# Patient Record
Sex: Female | Born: 1950 | Race: White | Hispanic: No | Smoking: Never smoker
Health system: Southern US, Community
[De-identification: ages and names within clinical notes are randomized; demographics above are authoritative.]

## PROBLEM LIST (undated history)

## (undated) DIAGNOSIS — E079 Disorder of thyroid, unspecified: Secondary | ICD-10-CM

## (undated) DIAGNOSIS — M1990 Unspecified osteoarthritis, unspecified site: Secondary | ICD-10-CM

## (undated) DIAGNOSIS — K219 Gastro-esophageal reflux disease without esophagitis: Secondary | ICD-10-CM

## (undated) DIAGNOSIS — F339 Major depressive disorder, recurrent, unspecified: Secondary | ICD-10-CM

## (undated) DIAGNOSIS — Z803 Family history of malignant neoplasm of breast: Secondary | ICD-10-CM

## (undated) DIAGNOSIS — T7840XA Allergy, unspecified, initial encounter: Secondary | ICD-10-CM

## (undated) DIAGNOSIS — E119 Type 2 diabetes mellitus without complications: Secondary | ICD-10-CM

## (undated) DIAGNOSIS — Z83518 Family history of other specified eye disorder: Secondary | ICD-10-CM

## (undated) DIAGNOSIS — I10 Essential (primary) hypertension: Secondary | ICD-10-CM

## (undated) HISTORY — DX: Family history of malignant neoplasm of breast: Z80.3

## (undated) HISTORY — DX: Essential (primary) hypertension: I10

## (undated) HISTORY — DX: Unspecified osteoarthritis, unspecified site: M19.90

## (undated) HISTORY — DX: Disorder of thyroid, unspecified: E07.9

## (undated) HISTORY — DX: Gastro-esophageal reflux disease without esophagitis: K21.9

## (undated) HISTORY — DX: Type 2 diabetes mellitus without complications: E11.9

## (undated) HISTORY — PX: VAGINAL HYSTERECTOMY: SUR661

## (undated) HISTORY — DX: Allergy, unspecified, initial encounter: T78.40XA

## (undated) HISTORY — DX: Major depressive disorder, recurrent, unspecified: F33.9

## (undated) HISTORY — DX: Family history of other specified eye disorder: Z83.518

---

## 2010-02-09 HISTORY — PX: COLONOSCOPY: SHX174

## 2013-12-14 ENCOUNTER — Ambulatory Visit: Payer: Self-pay | Admitting: Physician Assistant

## 2014-03-19 ENCOUNTER — Ambulatory Visit: Payer: Self-pay | Admitting: Physician Assistant

## 2014-06-08 ENCOUNTER — Encounter: Payer: Self-pay | Admitting: Family Medicine

## 2014-06-08 DIAGNOSIS — Z86010 Personal history of colonic polyps: Secondary | ICD-10-CM | POA: Insufficient documentation

## 2014-06-08 DIAGNOSIS — Z8719 Personal history of other diseases of the digestive system: Secondary | ICD-10-CM | POA: Insufficient documentation

## 2014-06-08 DIAGNOSIS — R7303 Prediabetes: Secondary | ICD-10-CM | POA: Insufficient documentation

## 2014-06-08 DIAGNOSIS — E559 Vitamin D deficiency, unspecified: Secondary | ICD-10-CM | POA: Insufficient documentation

## 2014-06-08 DIAGNOSIS — Z803 Family history of malignant neoplasm of breast: Secondary | ICD-10-CM | POA: Insufficient documentation

## 2014-06-08 DIAGNOSIS — F1011 Alcohol abuse, in remission: Secondary | ICD-10-CM | POA: Insufficient documentation

## 2014-06-08 DIAGNOSIS — Z6835 Body mass index (BMI) 35.0-35.9, adult: Secondary | ICD-10-CM | POA: Insufficient documentation

## 2014-06-08 DIAGNOSIS — Z83518 Family history of other specified eye disorder: Secondary | ICD-10-CM

## 2014-06-08 DIAGNOSIS — E039 Hypothyroidism, unspecified: Secondary | ICD-10-CM | POA: Insufficient documentation

## 2014-06-08 DIAGNOSIS — Z8249 Family history of ischemic heart disease and other diseases of the circulatory system: Secondary | ICD-10-CM | POA: Insufficient documentation

## 2014-06-08 HISTORY — DX: Family history of other specified eye disorder: Z83.518

## 2014-06-08 HISTORY — DX: Family history of malignant neoplasm of breast: Z80.3

## 2014-07-18 ENCOUNTER — Other Ambulatory Visit: Payer: Self-pay | Admitting: Family Medicine

## 2014-07-18 DIAGNOSIS — I10 Essential (primary) hypertension: Secondary | ICD-10-CM

## 2014-07-18 MED ORDER — LOSARTAN POTASSIUM-HCTZ 100-12.5 MG PO TABS: 1.0000 | ORAL_TABLET | Freq: Every day | ORAL | Status: DC

## 2014-07-30 ENCOUNTER — Other Ambulatory Visit: Payer: Self-pay

## 2014-07-30 DIAGNOSIS — I10 Essential (primary) hypertension: Secondary | ICD-10-CM

## 2014-07-30 MED ORDER — LOSARTAN POTASSIUM-HCTZ 100-12.5 MG PO TABS: 1.0000 | ORAL_TABLET | Freq: Every day | ORAL | Status: DC

## 2014-08-15 ENCOUNTER — Other Ambulatory Visit: Payer: Self-pay

## 2014-08-15 DIAGNOSIS — Z8719 Personal history of other diseases of the digestive system: Secondary | ICD-10-CM

## 2014-08-15 MED ORDER — OMEPRAZOLE 40 MG PO CPDR: 40.0000 mg | DELAYED_RELEASE_CAPSULE | Freq: Every day | ORAL | Status: DC

## 2014-08-15 NOTE — Telephone Encounter (Signed)
[facility]

## 2014-10-09 ENCOUNTER — Other Ambulatory Visit: Payer: Self-pay

## 2014-10-09 DIAGNOSIS — K219 Gastro-esophageal reflux disease without esophagitis: Secondary | ICD-10-CM

## 2014-10-09 DIAGNOSIS — E039 Hypothyroidism, unspecified: Secondary | ICD-10-CM

## 2014-10-09 DIAGNOSIS — E119 Type 2 diabetes mellitus without complications: Secondary | ICD-10-CM

## 2014-10-09 DIAGNOSIS — I10 Essential (primary) hypertension: Secondary | ICD-10-CM

## 2014-10-09 DIAGNOSIS — Z8719 Personal history of other diseases of the digestive system: Secondary | ICD-10-CM

## 2014-10-09 MED ORDER — LOSARTAN POTASSIUM-HCTZ 100-12.5 MG PO TABS: 1.0000 | ORAL_TABLET | Freq: Every day | ORAL | Status: DC

## 2014-10-09 MED ORDER — OMEPRAZOLE 40 MG PO CPDR: 40.0000 mg | DELAYED_RELEASE_CAPSULE | Freq: Every day | ORAL | Status: DC

## 2014-10-09 MED ORDER — THYROID 90 MG PO TABS: 90.0000 mg | ORAL_TABLET | Freq: Every day | ORAL | Status: DC

## 2014-10-09 MED ORDER — METFORMIN HCL ER 500 MG PO TB24: 500.0000 mg | ORAL_TABLET | Freq: Every day | ORAL | Status: DC

## 2014-10-30 ENCOUNTER — Encounter: Payer: Self-pay | Admitting: Family Medicine

## 2014-10-30 ENCOUNTER — Ambulatory Visit (INDEPENDENT_AMBULATORY_CARE_PROVIDER_SITE_OTHER): Payer: 59 | Admitting: Family Medicine

## 2014-10-30 VITALS — BP 140/67 | HR 67 | Ht 66.0 in | Wt 221.6 lb

## 2014-10-30 DIAGNOSIS — E039 Hypothyroidism, unspecified: Secondary | ICD-10-CM | POA: Diagnosis not present

## 2014-10-30 DIAGNOSIS — I10 Essential (primary) hypertension: Secondary | ICD-10-CM | POA: Diagnosis not present

## 2014-10-30 DIAGNOSIS — M7662 Achilles tendinitis, left leg: Secondary | ICD-10-CM | POA: Diagnosis not present

## 2014-10-30 DIAGNOSIS — R7309 Other abnormal glucose: Secondary | ICD-10-CM

## 2014-10-30 DIAGNOSIS — R030 Elevated blood-pressure reading, without diagnosis of hypertension: Secondary | ICD-10-CM | POA: Insufficient documentation

## 2014-10-30 DIAGNOSIS — E669 Obesity, unspecified: Secondary | ICD-10-CM

## 2014-10-30 DIAGNOSIS — R5383 Other fatigue: Secondary | ICD-10-CM

## 2014-10-30 DIAGNOSIS — R7303 Prediabetes: Secondary | ICD-10-CM

## 2014-10-30 DIAGNOSIS — E559 Vitamin D deficiency, unspecified: Secondary | ICD-10-CM

## 2014-10-30 NOTE — Addendum Note (Signed)
Addended by: Schuyler Amor on: 10/30/2014 11:06 AM   Modules accepted: Orders

## 2014-10-30 NOTE — Progress Notes (Addendum)
Date:  10/30/2014   Name:  Shavonn Convey   DOB:  23-Aug-1950   MRN:  [id number]  PCP:  Schuyler Amor, MD    Chief Complaint: Foot Pain   History of Present Illness:  This is a 64 y.o. female c/o progressive L posterior heel pain over several months, worse in AM or with prolonged walking, Aspercreme/ibuprofen some help. Also with HTN, DM, vit D def, GERD well controlled, saw GI last spring told to continue PPI. Taking asa and chromium intermittently. C/o fatigue, insomnia, has been on antidepressants in past.  Review of Systems:  Review of Systems  Constitutional: Negative for fever, chills and unexpected weight change.  Respiratory: Negative for shortness of breath.   Cardiovascular: Negative for chest pain and leg swelling.  Endocrine: Negative for polyuria.  Genitourinary: Negative for difficulty urinating.    Patient Active Problem List   Diagnosis Date Noted  . Hypertension 10/30/2014  . Fatigue 06/08/2014  . Family history of breast cancer 06/08/2014  . Family history of premature coronary heart disease 06/08/2014  . Family history of macular degeneration 06/08/2014  . H/O alcohol abuse 06/08/2014  . History of colon polyps 06/08/2014  . H/O gastrointestinal disease 06/08/2014  . Adult hypothyroidism 06/08/2014  . Adiposity 06/08/2014  . Borderline diabetes 06/08/2014  . Avitaminosis D 06/08/2014    Prior to Admission medications   Medication Sig Start Date End Date Taking? Authorizing Provider  aspirin 81 MG tablet Take 1 tablet by mouth daily.   Yes Historical Provider, MD  cetirizine (ZYRTEC) 10 MG chewable tablet Chew 1 tablet by mouth daily as needed.   Yes Historical Provider, MD  losartan-hydrochlorothiazide (HYZAAR) 100-12.5 MG per tablet Take 1 tablet by mouth daily. 10/09/14  Yes Schuyler Amor, MD  metFORMIN (GLUCOPHAGE-XR) 500 MG 24 hr tablet Take 1 tablet (500 mg total) by mouth daily. 10/09/14  Yes Schuyler Amor, MD  Multiple Vitamins-Minerals (OCUVITE  ADULT 50+) CAPS Take 1 tablet by mouth daily.   Yes Historical Provider, MD  NATURE-THROID 97.5 MG TABS  10/03/14  Yes Historical Provider, MD  omeprazole (PRILOSEC) 40 MG capsule Take 1 capsule (40 mg total) by mouth daily. 10/09/14  Yes Schuyler Amor, MD  Vitamin D, Cholecalciferol, 1000 UNITS TABS Take 1 tablet by mouth daily. 05/30/14  Yes Historical Provider, MD    No Known Allergies  No past surgical history on file.  Social History  Substance Use Topics  . Smoking status: Never Smoker   . Smokeless tobacco: Not on file  . Alcohol Use: No    No family history on file.  Medication list has been reviewed and updated.  Physical Examination: BP 140/67 mmHg  Pulse 67  Ht  (1.676 m)  Wt 221 lb 9.6 oz (100.517 kg)  BMI 35.78 kg/m2  Physical Exam  Constitutional: She appears well-developed and well-nourished. No distress.  Cardiovascular: Normal rate, regular rhythm and normal heart sounds.   Pulmonary/Chest: Effort normal and breath sounds normal.  Musculoskeletal: She exhibits no edema.  Tender over Achilles insertion into L calcaneus No anterior calcaneal tenderness  Neurological: She is alert. Coordination normal.  Skin: Skin is warm and dry.  Psychiatric: She has a normal mood and affect. Her behavior is normal.    Assessment and Plan:  1. Tendonitis, Achilles, left Ibuprofen prn until sees podiatry - Ambulatory referral to Podiatry  2. Hypothyroidism, unspecified hypothyroidism type S/p I-131, on oral replacement - TSH  3. Essential hypertension Marginal control today, monitor  4. Borderline  diabetes Discussed chromium and asa use, no clear indication but pt may continue - HgB A1c - Lipid Profile  5. Avitaminosis D On supplementation - Vitamin D (25 hydroxy)  6. Adiposity Discussed exercise/weight loss  7. Other fatigue Possible depression, consider antidepressant if labs normal - Comprehensive metabolic panel - CBC - B12   Return in about  4 weeks (around 11/27/2014).  [name] MD [facility]  10/30/2014

## 2014-10-31 LAB — COMPREHENSIVE METABOLIC PANEL
ALBUMIN: 4.2 g/dL (ref 3.6–4.8)
ALK PHOS: 60 IU/L (ref 39–117)
ALT: 30 IU/L (ref 0–32)
AST: 23 IU/L (ref 0–40)
Albumin/Globulin Ratio: 1.8 (ref 1.1–2.5)
BUN / CREAT RATIO: 15 (ref 11–26)
BUN: 14 mg/dL (ref 8–27)
Bilirubin Total: 0.2 mg/dL (ref 0.0–1.2)
CO2: 25 mmol/L (ref 18–29)
CREATININE: 0.96 mg/dL (ref 0.57–1.00)
Calcium: 9.4 mg/dL (ref 8.7–10.3)
Chloride: 100 mmol/L (ref 97–108)
GFR calc non Af Amer: 64 mL/min/{1.73_m2} (ref >59)
GFR, EST AFRICAN AMERICAN: 73 mL/min/{1.73_m2} (ref >59)
GLOBULIN, TOTAL: 2.4 g/dL (ref 1.5–4.5)
Glucose: 95 mg/dL (ref 65–99)
Potassium: 4.2 mmol/L (ref 3.5–5.2)
SODIUM: 142 mmol/L (ref 134–144)
Total Protein: 6.6 g/dL (ref 6.0–8.5)

## 2014-10-31 LAB — LIPID PANEL
Chol/HDL Ratio: 3.5 ratio units (ref 0.0–4.4)
Cholesterol, Total: 191 mg/dL (ref 100–199)
HDL: 55 mg/dL (ref >39)
LDL Calculated: 108 mg/dL — ABNORMAL HIGH (ref 0–99)
Triglycerides: 142 mg/dL (ref 0–149)
VLDL CHOLESTEROL CAL: 28 mg/dL (ref 5–40)

## 2014-10-31 LAB — HEMOGLOBIN A1C
Est. average glucose Bld gHb Est-mCnc: 140 mg/dL
Hgb A1c MFr Bld: 6.5 % — ABNORMAL HIGH (ref 4.8–5.6)

## 2014-10-31 LAB — VITAMIN B12: Vitamin B-12: 479 pg/mL (ref 211–946)

## 2014-10-31 LAB — CBC
Hematocrit: 36 % (ref 34.0–46.6)
Hemoglobin: 12 g/dL (ref 11.1–15.9)
MCH: 29.1 pg (ref 26.6–33.0)
MCHC: 33.3 g/dL (ref 31.5–35.7)
MCV: 87 fL (ref 79–97)
PLATELETS: 314 10*3/uL (ref 150–379)
RBC: 4.12 x10E6/uL (ref 3.77–5.28)
RDW: 13.8 % (ref 12.3–15.4)
WBC: 7 10*3/uL (ref 3.4–10.8)

## 2014-10-31 LAB — TSH: TSH: 0.257 u[IU]/mL — ABNORMAL LOW (ref 0.450–4.500)

## 2014-10-31 LAB — VITAMIN D 25 HYDROXY (VIT D DEFICIENCY, FRACTURES): Vit D, 25-Hydroxy: 35.4 ng/mL (ref 30.0–100.0)

## 2014-10-31 MED ORDER — THYROID 81.25 MG PO TABS: 1.0000 | ORAL_TABLET | Freq: Every day | ORAL | Status: DC

## 2014-10-31 NOTE — Addendum Note (Signed)
Addended by: Schuyler Amor on: 10/31/2014 09:34 AM   Modules accepted: Orders

## 2014-10-31 NOTE — Addendum Note (Signed)
Addended by: Schuyler Amor on: 10/31/2014 09:30 AM   Modules accepted: Orders

## 2014-11-01 ENCOUNTER — Telehealth: Payer: Self-pay

## 2014-11-01 MED ORDER — THYROID 81.25 MG PO TABS: 1.0000 | ORAL_TABLET | Freq: Every day | ORAL | Status: DC

## 2014-11-01 NOTE — Telephone Encounter (Signed)
Patient requests new thyroid rx sent to [facility]

## 2014-11-01 NOTE — Addendum Note (Signed)
Addended by: Schuyler Amor on: 11/01/2014 02:59 PM   Modules accepted: Orders, Medications

## 2014-11-01 NOTE — Telephone Encounter (Signed)
Done

## 2014-11-30 ENCOUNTER — Ambulatory Visit (INDEPENDENT_AMBULATORY_CARE_PROVIDER_SITE_OTHER): Payer: 59 | Admitting: Family Medicine

## 2014-11-30 ENCOUNTER — Encounter: Payer: Self-pay | Admitting: Family Medicine

## 2014-11-30 VITALS — BP 122/80 | HR 68 | Ht 67.0 in | Wt 221.0 lb

## 2014-11-30 DIAGNOSIS — K219 Gastro-esophageal reflux disease without esophagitis: Secondary | ICD-10-CM | POA: Diagnosis not present

## 2014-11-30 DIAGNOSIS — I10 Essential (primary) hypertension: Secondary | ICD-10-CM

## 2014-11-30 DIAGNOSIS — E039 Hypothyroidism, unspecified: Secondary | ICD-10-CM

## 2014-11-30 DIAGNOSIS — E559 Vitamin D deficiency, unspecified: Secondary | ICD-10-CM | POA: Diagnosis not present

## 2014-11-30 DIAGNOSIS — E119 Type 2 diabetes mellitus without complications: Secondary | ICD-10-CM

## 2014-11-30 DIAGNOSIS — F339 Major depressive disorder, recurrent, unspecified: Secondary | ICD-10-CM | POA: Insufficient documentation

## 2014-11-30 DIAGNOSIS — F331 Major depressive disorder, recurrent, moderate: Secondary | ICD-10-CM | POA: Diagnosis not present

## 2014-11-30 DIAGNOSIS — K2100 Gastro-esophageal reflux disease with esophagitis, without bleeding: Secondary | ICD-10-CM | POA: Insufficient documentation

## 2014-11-30 HISTORY — DX: Major depressive disorder, recurrent, unspecified: F33.9

## 2014-11-30 MED ORDER — VENLAFAXINE HCL ER 75 MG PO CP24: 75.0000 mg | ORAL_CAPSULE | Freq: Every day | ORAL | Status: DC

## 2014-11-30 NOTE — Progress Notes (Signed)
Date:  11/30/2014   Name:  Audrey Harrison   DOB:  10/23/1950   MRN:  [id number]  PCP:  Schuyler AmorWilliam Quinterrius Errington, MD    Chief Complaint: Hypothyroidism   History of Present Illness:  This is a 64 y.o. female for f/u hypothyroidism, HTN, T2DM, vit D def, and fatigue. Blood work last visit ok except low TSH, Nature Thyroid decreased to 81.25 mg, for repeat TSH today. Saw podiatry for L heel spur rx'd with steroid taper and Lodine prn, improved. Continues with fatigue, works 3rd shift, has tried antidepressants a couple of times with some success but never stayed on, interested in trying another. Weight the same, not exercising regularly. Occ B ankle edema, resolves overnight.  Review of Systems:  Review of Systems  Constitutional: Negative for fever and unexpected weight change.  Respiratory: Negative for shortness of breath.   Cardiovascular: Negative for chest pain.  Gastrointestinal: Negative for abdominal pain, diarrhea and constipation.  Genitourinary: Negative for difficulty urinating.  Neurological: Negative for syncope and light-headedness.  Psychiatric/Behavioral: Negative for suicidal ideas and confusion.    Patient Active Problem List   Diagnosis Date Noted  . Depression, major, recurrent (HCC) 11/30/2014  . Hypertension 10/30/2014  . Fatigue 06/08/2014  . Family history of breast cancer 06/08/2014  . Family history of premature coronary heart disease 06/08/2014  . Family history of macular degeneration 06/08/2014  . H/O alcohol abuse 06/08/2014  . History of colon polyps 06/08/2014  . H/O gastrointestinal disease 06/08/2014  . Adult hypothyroidism 06/08/2014  . Obesity, Class I, BMI 30-34.9 06/08/2014  . Diabetes mellitus type 2, controlled, without complications (HCC) 06/08/2014  . Avitaminosis D 06/08/2014    Prior to Admission medications   Medication Sig Start Date End Date Taking? Authorizing Provider  aspirin 81 MG tablet Take 1 tablet by mouth daily.   Yes  Historical Provider, MD  cetirizine (ZYRTEC) 10 MG chewable tablet Chew 1 tablet by mouth daily as needed.   Yes Historical Provider, MD  etodolac (LODINE) 500 MG tablet Take 1 tablet by mouth 2 (two) times daily as needed. 11/05/14 12/05/14 Yes Historical Provider, MD  losartan-hydrochlorothiazide (HYZAAR) 100-12.5 MG per tablet Take 1 tablet by mouth daily. 10/09/14  Yes Schuyler AmorWilliam Kamisha Ell, MD  metFORMIN (GLUCOPHAGE-XR) 500 MG 24 hr tablet Take 1 tablet (500 mg total) by mouth daily. 10/09/14  Yes Schuyler AmorWilliam Zarie Kosiba, MD  Multiple Vitamins-Minerals (OCUVITE ADULT 50+) CAPS Take 1 tablet by mouth daily.   Yes Historical Provider, MD  omeprazole (PRILOSEC) 40 MG capsule Take 1 capsule (40 mg total) by mouth daily. 10/09/14  Yes Schuyler AmorWilliam Betzayda Braxton, MD  Thyroid (NATURE-THROID) 81.25 MG TABS Take 1 tablet by mouth daily. 11/01/14  Yes Schuyler AmorWilliam Sayer Masini, MD  Vitamin D, Cholecalciferol, 1000 UNITS TABS Take 1 tablet by mouth daily. 05/30/14  Yes Historical Provider, MD  venlafaxine XR (EFFEXOR-XR) 75 MG 24 hr capsule Take 1 capsule (75 mg total) by mouth daily with breakfast. 11/30/14   Schuyler AmorWilliam Malak Duchesneau, MD    No Known Allergies  Past Surgical History  Procedure Laterality Date  . Vaginal hysterectomy    . Colonoscopy  2012    cle[place] yrs- [place]    Social History  Substance Use Topics  . Smoking status: Never Smoker   . Smokeless tobacco: None  . Alcohol Use: No    Family History  Problem Relation Age of Onset  . Hypertension Mother   . Cancer Sister   . Hypertension Sister   . Cancer Brother   .  Diabetes Paternal Grandmother   . Hypertension Paternal Grandmother     Medication list has been reviewed and updated.  Physical Examination: BP 122/80 mmHg  Pulse 68  Ht  (1.702 m)  Wt 221 lb (100.245 kg)  BMI 34.61 kg/m2  Physical Exam  Constitutional: She is oriented to person, place, and time. She appears well-developed and well-nourished.  Cardiovascular: Normal rate, regular rhythm and  normal heart sounds.   Pulmonary/Chest: Effort normal and breath sounds normal.  Musculoskeletal: She exhibits no edema.  Neurological: She is alert and oriented to person, place, and time. Coordination normal.  Skin: Skin is warm and dry.  Psychiatric: She has a normal mood and affect. Her behavior is normal. Judgment and thought content normal.  Nursing note and vitals reviewed.   Assessment and Plan:  1. Moderate episode of recurrent major depressive disorder (HCC) Trial Effexor XR given fatigue - venlafaxine XR (EFFEXOR-XR) 75 MG 24 hr capsule; Take 1 capsule (75 mg total) by mouth daily with breakfast.  Dispense: 30 capsule; Refill: 2  2. Hypothyroidism, unspecified hypothyroidism type Recheck TSH on lower dose Nature Thyroid, consider conversion to Synthroid if titration difficult - TSH  3. Essential hypertension Well controlled, continue current regimen  4. Controlled type 2 diabetes mellitus without complication, without long-term current use of insulin (HCC) Well controlled (a1c 6.5%), continue metformin, needs MCR and optho exam  5. Avitaminosis D Well controlled, continue supplementation  6. GERD without esophagitis Well controlled, continue PPI per GI  7. Obesity Discussed exercise/weight loss   No Follow-up on file.  [name] MD [facility]  11/30/2014

## 2014-12-01 LAB — TSH: TSH: 0.429 u[IU]/mL — ABNORMAL LOW (ref 0.450–4.500)

## 2014-12-03 ENCOUNTER — Other Ambulatory Visit: Payer: Self-pay | Admitting: Family Medicine

## 2014-12-03 MED ORDER — THYROID 65 MG PO TABS: 65.0000 mg | ORAL_TABLET | Freq: Every day | ORAL | Status: DC

## 2014-12-03 NOTE — Addendum Note (Signed)
Addended by: Schuyler AmorPLONK, Dariyon Urquilla on: 12/03/2014 08:54 AM   Modules accepted: Orders, Medications

## 2014-12-06 ENCOUNTER — Telehealth: Payer: Self-pay

## 2014-12-06 DIAGNOSIS — E119 Type 2 diabetes mellitus without complications: Secondary | ICD-10-CM

## 2014-12-06 MED ORDER — METFORMIN HCL ER 500 MG PO TB24: 500.0000 mg | ORAL_TABLET | Freq: Every day | ORAL | Status: DC

## 2014-12-06 NOTE — Addendum Note (Signed)
Addended by: Schuyler AmorPLONK, Jawan Chavarria on: 12/06/2014 09:53 AM   Modules accepted: Orders

## 2014-12-06 NOTE — Telephone Encounter (Signed)
Glucophage XR refills sent

## 2014-12-31 ENCOUNTER — Encounter: Payer: Self-pay | Admitting: Family Medicine

## 2014-12-31 ENCOUNTER — Ambulatory Visit (INDEPENDENT_AMBULATORY_CARE_PROVIDER_SITE_OTHER): Payer: 59 | Admitting: Family Medicine

## 2014-12-31 VITALS — BP 132/78 | HR 88 | Ht 67.0 in | Wt 215.0 lb

## 2014-12-31 DIAGNOSIS — I10 Essential (primary) hypertension: Secondary | ICD-10-CM | POA: Diagnosis not present

## 2014-12-31 DIAGNOSIS — E119 Type 2 diabetes mellitus without complications: Secondary | ICD-10-CM

## 2014-12-31 DIAGNOSIS — F331 Major depressive disorder, recurrent, moderate: Secondary | ICD-10-CM | POA: Diagnosis not present

## 2014-12-31 DIAGNOSIS — E559 Vitamin D deficiency, unspecified: Secondary | ICD-10-CM

## 2014-12-31 DIAGNOSIS — K219 Gastro-esophageal reflux disease without esophagitis: Secondary | ICD-10-CM

## 2014-12-31 DIAGNOSIS — Z1159 Encounter for screening for other viral diseases: Secondary | ICD-10-CM | POA: Diagnosis not present

## 2014-12-31 DIAGNOSIS — E669 Obesity, unspecified: Secondary | ICD-10-CM

## 2014-12-31 DIAGNOSIS — E039 Hypothyroidism, unspecified: Secondary | ICD-10-CM

## 2014-12-31 MED ORDER — VENLAFAXINE HCL ER 150 MG PO CP24: 150.0000 mg | ORAL_CAPSULE | Freq: Every day | ORAL | Status: DC

## 2015-01-01 ENCOUNTER — Telehealth: Payer: Self-pay

## 2015-01-01 LAB — HEPATITIS C ANTIBODY

## 2015-01-01 LAB — TSH: TSH: 2.18 u[IU]/mL (ref 0.450–4.500)

## 2015-01-01 MED ORDER — VENLAFAXINE HCL ER 150 MG PO CP24: 150.0000 mg | ORAL_CAPSULE | Freq: Every day | ORAL | Status: DC

## 2015-01-01 NOTE — Progress Notes (Signed)
Date:  12/31/2014   Name:  Audrey Harrison   DOB:  [birth year]   MRN:  [id number]  PCP:  Schuyler AmorWilliam Lashanna Angelo, MD    Chief Complaint: Hypertension and Hypothyroidism   History of Present Illness:  This is a [age] y.o. female placed on Effexor XR 75 mg daily last month, feels mood and fatigue somewhat improved, a little more jittery. Nature thyroid reduced to 65 mg daily last month, says is lowest dose she's ever taken, switched to nature thyroid two years ago because Synthroid didn't seem to work well but hasn't noticed much difference. T2DM well controlled a1c 6.5% in September. On vit D supplementation with nl level in September. Has has tetanus and flu imms recently, had shingles imm in past, interested in being checked for hep C.  Review of Systems:  Review of Systems  Constitutional: Negative for fever and unexpected weight change.  Respiratory: Negative for shortness of breath.   Cardiovascular: Negative for chest pain and leg swelling.  Gastrointestinal: Negative for abdominal pain.  Endocrine: Negative for polyuria.  Genitourinary: Negative for difficulty urinating.  Neurological: Negative for syncope and light-headedness.    Patient Active Problem List   Diagnosis Date Noted  . Depression, major, recurrent (HCC) 11/30/2014  . GERD without esophagitis 11/30/2014  . Hypertension 10/30/2014  . Fatigue 06/08/2014  . Family history of breast cancer 06/08/2014  . Family history of premature coronary heart disease 06/08/2014  . Family history of macular degeneration 06/08/2014  . H/O alcohol abuse 06/08/2014  . History of colon polyps 06/08/2014  . H/O gastrointestinal disease 06/08/2014  . Adult hypothyroidism 06/08/2014  . Obesity, Class I, BMI 30-34.9 06/08/2014  . Diabetes mellitus type 2, controlled, without complications (HCC) 06/08/2014  . Avitaminosis D 06/08/2014    Prior to Admission medications   Medication Sig Start Date End Date Taking? Authorizing Provider   aspirin 81 MG tablet Take 1 tablet by mouth daily.   Yes Historical Provider, MD  cetirizine (ZYRTEC) 10 MG chewable tablet Chew 1 tablet by mouth daily as needed.   Yes Historical Provider, MD  losartan-hydrochlorothiazide (HYZAAR) 100-12.5 MG per tablet Take 1 tablet by mouth daily. 10/09/14  Yes Schuyler AmorWilliam Vincent Ehrler, MD  metFORMIN (GLUCOPHAGE-XR) 500 MG 24 hr tablet Take 1 tablet (500 mg total) by mouth daily. 12/06/14  Yes Schuyler AmorWilliam Jorma Tassinari, MD  Multiple Vitamins-Minerals (OCUVITE ADULT 50+) CAPS Take 1 tablet by mouth daily.   Yes Historical Provider, MD  omeprazole (PRILOSEC) 40 MG capsule Take 1 capsule (40 mg total) by mouth daily. 10/09/14  Yes Schuyler AmorWilliam Aaidyn San, MD  thyroid (ARMOUR) 65 MG tablet Take 1 tablet (65 mg total) by mouth daily. 12/03/14  Yes Schuyler AmorWilliam Travin Marik, MD  Vitamin D, Cholecalciferol, 1000 UNITS TABS Take 1 tablet by mouth daily. 05/30/14  Yes Historical Provider, MD  venlafaxine XR (EFFEXOR-XR) 150 MG 24 hr capsule Take 1 capsule (150 mg total) by mouth daily with breakfast. 01/01/15   Schuyler AmorWilliam Julieta Rogalski, MD    No Known Allergies  Past Surgical History  Procedure Laterality Date  . Vaginal hysterectomy    . Colonoscopy  2012    cleared for 10 yrs- [place]    Social History  Substance Use Topics  . Smoking status: Never Smoker   . Smokeless tobacco: None  . Alcohol Use: No    Family History  Problem Relation Age of Onset  . Hypertension Mother   . Cancer Sister   . Hypertension Sister   . Cancer Brother   . Diabetes  Paternal Grandmother   . Hypertension Paternal Grandmother     Medication list has been reviewed and updated.  Physical Examination: BP 132/78 mmHg  Pulse 88  Ht  (1.702 m)  Wt 215 lb (97.523 kg)  BMI 33.67 kg/m2  Physical Exam  Constitutional: She appears well-developed and well-nourished.  Cardiovascular: Normal rate, regular rhythm and normal heart sounds.   Pulmonary/Chest: Effort normal and breath sounds normal.  Musculoskeletal: She  exhibits no edema.  Neurological: She is alert.  No tremor  Skin: Skin is warm and dry.  Psychiatric: She has a normal mood and affect. Her behavior is normal.  Nursing note and vitals reviewed.   Assessment and Plan:  1. Moderate episode of recurrent major depressive disorder (HCC) Marginal improvement, increase Effexor XR to 150 mg daily  2. Hypothyroidism, unspecified hypothyroidism type Recheck level on lower dose Nature Thyroid - TSH  3. Controlled type 2 diabetes mellitus without complication, without long-term current use of insulin (HCC) Well controlled on daily metformin  4. Avitaminosis D Well controlled on oral supplementation  5. Obesity, Class I, BMI 30-34.9 Weight loss benefits discussed  6. Need for hepatitis C screening test - Hepatitis C Antibody  7. HTN Well controlled on current regimen  8. GERD Continue long term PPI per GI   Return in about 4 weeks (around 01/28/2015).  [name] MD [facility]  01/01/2015

## 2015-01-01 NOTE — Addendum Note (Signed)
Addended by: Schuyler AmorPLONK, Kendel Bessey on: 01/01/2015 08:49 AM   Modules accepted: Orders

## 2015-01-01 NOTE — Telephone Encounter (Signed)
[facility]

## 2015-01-30 ENCOUNTER — Encounter: Payer: Self-pay | Admitting: Family Medicine

## 2015-01-30 ENCOUNTER — Ambulatory Visit (INDEPENDENT_AMBULATORY_CARE_PROVIDER_SITE_OTHER): Payer: 59 | Admitting: Family Medicine

## 2015-01-30 VITALS — BP 128/80 | Ht 67.0 in | Wt 219.0 lb

## 2015-01-30 DIAGNOSIS — E119 Type 2 diabetes mellitus without complications: Secondary | ICD-10-CM | POA: Diagnosis not present

## 2015-01-30 DIAGNOSIS — E039 Hypothyroidism, unspecified: Secondary | ICD-10-CM | POA: Diagnosis not present

## 2015-01-30 DIAGNOSIS — E559 Vitamin D deficiency, unspecified: Secondary | ICD-10-CM

## 2015-01-30 DIAGNOSIS — I10 Essential (primary) hypertension: Secondary | ICD-10-CM

## 2015-01-30 DIAGNOSIS — F331 Major depressive disorder, recurrent, moderate: Secondary | ICD-10-CM

## 2015-01-30 DIAGNOSIS — E669 Obesity, unspecified: Secondary | ICD-10-CM

## 2015-01-30 NOTE — Progress Notes (Signed)
Date:  01/30/2015   Name:  Audrey Harrison   DOB:  February 18, 1950   MRN:  [id number]  PCP:  Schuyler Amor, MD    Chief Complaint: Diabetes; Hypertension; and Hypothyroidism   History of Present Illness:  This is a 64 y.o. female for f/u depression, diabetes, and hypothyroidism. Effexor XR dose increased last visit with improvement in mood and irritability. TSH last visit normal. On metformin alone for DM, last a1c 6.5% in September. VIt d level normal on supplement. Weight up 4#, pt interested in MNT/diabetic education. Saw optho 08/2014.  Review of Systems:  Review of Systems  Constitutional: Negative for fever.  Respiratory: Negative for shortness of breath.   Cardiovascular: Negative for chest pain and leg swelling.  Endocrine: Negative for polyuria.  Genitourinary: Negative for difficulty urinating.  Neurological: Negative for syncope and light-headedness.    Patient Active Problem List   Diagnosis Date Noted  . Depression, major, recurrent (HCC) 11/30/2014  . GERD without esophagitis 11/30/2014  . Hypertension 10/30/2014  . Fatigue 06/08/2014  . Family history of breast cancer 06/08/2014  . Family history of premature coronary heart disease 06/08/2014  . Family history of macular degeneration 06/08/2014  . H/O alcohol abuse 06/08/2014  . History of colon polyps 06/08/2014  . H/O gastrointestinal disease 06/08/2014  . Adult hypothyroidism 06/08/2014  . Obesity, Class I, BMI 30-34.9 06/08/2014  . Diabetes mellitus type 2, controlled, without complications (HCC) 06/08/2014  . Vitamin D deficiency 06/08/2014    Prior to Admission medications   Medication Sig Start Date End Date Taking? Authorizing Provider  aspirin 81 MG tablet Take 1 tablet by mouth daily.   Yes Historical Provider, MD  losartan-hydrochlorothiazide (HYZAAR) 100-12.5 MG per tablet Take 1 tablet by mouth daily. 10/09/14  Yes Schuyler Amor, MD  metFORMIN (GLUCOPHAGE-XR) 500 MG 24 hr tablet Take 1 tablet (500  mg total) by mouth daily. 12/06/14  Yes Schuyler Amor, MD  Multiple Vitamins-Minerals (OCUVITE ADULT 50+) CAPS Take 1 tablet by mouth daily.   Yes Historical Provider, MD  omeprazole (PRILOSEC) 40 MG capsule Take 1 capsule (40 mg total) by mouth daily. 10/09/14  Yes Schuyler Amor, MD  thyroid (ARMOUR) 65 MG tablet Take 1 tablet (65 mg total) by mouth daily. 12/03/14  Yes Schuyler Amor, MD  venlafaxine XR (EFFEXOR-XR) 150 MG 24 hr capsule Take 1 capsule (150 mg total) by mouth daily with breakfast. 01/01/15  Yes Schuyler Amor, MD  Vitamin D, Cholecalciferol, 1000 UNITS TABS Take 1 tablet by mouth daily. 05/30/14  Yes Historical Provider, MD  cetirizine (ZYRTEC) 10 MG chewable tablet Chew 1 tablet by mouth daily as needed. Reported on 01/30/2015    Historical Provider, MD    No Known Allergies  Past Surgical History  Procedure Laterality Date  . Vaginal hysterectomy    . Colonoscopy  2012    cleared for 10 yrs- [place] MD    Social History  Substance Use Topics  . Smoking status: Never Smoker   . Smokeless tobacco: Not on file  . Alcohol Use: No    Family History  Problem Relation Age of Onset  . Hypertension Mother   . Cancer Sister   . Hypertension Sister   . Cancer Brother   . Diabetes Paternal Grandmother   . Hypertension Paternal Grandmother     Medication list has been reviewed and updated.  Physical Examination: BP 128/80 mmHg  Ht  (1.702 m)  Wt 219 lb (99.338 kg)  BMI 34.29 kg/m2  Physical  Exam  Constitutional: She appears well-developed and well-nourished.  Cardiovascular: Normal rate, regular rhythm and normal heart sounds.   Pulmonary/Chest: Effort normal and breath sounds normal.  Musculoskeletal: She exhibits no edema.  Neurological: She is alert.  Skin: Skin is warm and dry.  Psychiatric: She has a normal mood and affect. Her behavior is normal.  Nursing note and vitals reviewed.   Assessment and Plan:  1. Moderate episode of recurrent major  depressive disorder (HCC) Improved on increased Effexor XR dose  2. Controlled type 2 diabetes mellitus without complication, without long-term current use of insulin (HCC) Well controlled, on asa, consider statin given calculated 6974yr CV risk 11.1% - HgB A1c - Urine Microalbumin w/creat. ratio - Ambulatory referral to diabetic education  3. Essential hypertension Well controlled on current regimen  4. Hypothyroidism, unspecified hypothyroidism type Well controlled on current replacement dose  5. Obesity, Class I, BMI 30-34.9 Exercise/weight loss discussed, discuss further with diabetic educator  6. Vitamin D deficiency Well controlled on current supplement  Return in about 3 months (around 04/30/2015).  Dionne AnoWilliam M. Kingsley SpittlePlonk, Jr. MD [facility]  01/30/2015

## 2015-01-31 LAB — HEMOGLOBIN A1C
Est. average glucose Bld gHb Est-mCnc: 134 mg/dL
Hgb A1c MFr Bld: 6.3 % — ABNORMAL HIGH (ref 4.8–5.6)

## 2015-01-31 LAB — MICROALBUMIN / CREATININE URINE RATIO
CREATININE, UR: 93.8 mg/dL
MICROALB/CREAT RATIO: 5.1 mg/g{creat} (ref 0.0–30.0)
Microalbumin, Urine: 4.8 ug/mL

## 2015-02-15 ENCOUNTER — Encounter: Payer: Self-pay | Admitting: Dietician

## 2015-02-15 ENCOUNTER — Encounter: Payer: 59 | Attending: Family Medicine | Admitting: Dietician

## 2015-02-15 VITALS — BP 136/82 | Ht 67.0 in | Wt 218.9 lb

## 2015-02-15 DIAGNOSIS — E119 Type 2 diabetes mellitus without complications: Secondary | ICD-10-CM

## 2015-02-15 NOTE — Patient Instructions (Signed)
To check blood sugars 2 x/day, after sleep (approx. 8:30pm) and after largest meal at 11:00-12:00am. To avoid sugar sweetened beverages To space meals 4-6 hours apart. To bring blood sugar records to Class 1 on 02/19/15

## 2015-02-15 NOTE — Progress Notes (Signed)
Diabetes Self-Management Education  Visit Type: First/Initial  Appt. Start Time: 9:30am Appt. End Time: 10:30am  02/15/2015  [name], identified by name and date of birth, is a 65 y.o. female with a diagnosis of Diabetes: Type 2.   ASSESSMENT  Blood pressure 136/82, height 5\' 7"  (1.702 m), weight 218 lb 14.4 oz (99.292 kg). Body mass index is 34.28 kg/(m^2).      Diabetes Self-Management Education - 02/15/15 1128    Visit Information   Visit Type First/Initial   Initial Visit   Diabetes Type Type 2   Complications   Last HgB A1C per patient/outside source 6.3 %   How often do you check your blood sugar? --  2-3 times per month   Fasting Blood glucose range (mg/dL) [phone]   Have you had a dilated eye exam in the past 12 months? Yes   Have you had a dental exam in the past 12 months? No   Are you checking your feet? No   Dietary Intake   Breakfast --  2-3 meals/day; patient works night shift; 10pm-8:00am   Beverage(s) water, 20 oz. regular coke/day, tea with very small amount of sugar added   Exercise   Exercise Type ADL's   Patient Education   Disease state  Definition of diabetes, type 1 and 2, and the diagnosis of diabetes   Nutrition management  Role of diet in the treatment of diabetes and the relationship between the three main macronutrients and blood glucose level  Instructed on general nutrition guidelines for diabetes   Physical activity and exercise  Role of exercise on diabetes management, blood pressure control and cardiac health.   Monitoring Purpose and frequency of SMBG.;Taught/discussed recording of test results and interpretation of SMBG.   Acute complications Taught treatment of hypoglycemia - the 15 rule.   Personal strategies to promote health Lifestyle issues that need to be addressed for better diabetes care      Individualized Plan for Diabetes Self-Management Training:   Learning Objective:  Patient will have a greater understanding of  diabetes self-management. Patient education plan is to attend individual and/or group sessions per assessed needs and concerns.   Plan:   To check blood sugars 2 x/day, after sleep (approx. 8:30pm) and after largest meal at 11:00-12:00am. To avoid sugar sweetened beverages To space meals 4-6 hours apart. To bring blood sugar records to Class 1 on 02/19/15  Education material provided: General Meal Plan Guidelines If problems or questions, patient to contact team via: Darrel Reacholleen Jaxsun Ciampi, RD,CDE at [phone] Future DSME appointment:   02/19/15

## 2015-02-19 ENCOUNTER — Ambulatory Visit: Payer: Self-pay | Admitting: Dietician

## 2015-02-25 ENCOUNTER — Other Ambulatory Visit: Payer: Self-pay | Admitting: Family Medicine

## 2015-02-25 DIAGNOSIS — E119 Type 2 diabetes mellitus without complications: Secondary | ICD-10-CM

## 2015-02-25 MED ORDER — METFORMIN HCL ER 500 MG PO TB24: 500.0000 mg | ORAL_TABLET | Freq: Every day | ORAL | Status: DC

## 2015-02-28 ENCOUNTER — Encounter: Payer: 59 | Admitting: Dietician

## 2015-02-28 ENCOUNTER — Other Ambulatory Visit: Payer: Self-pay | Admitting: Family Medicine

## 2015-02-28 VITALS — Ht 67.0 in | Wt 220.3 lb

## 2015-02-28 DIAGNOSIS — E119 Type 2 diabetes mellitus without complications: Secondary | ICD-10-CM | POA: Diagnosis not present

## 2015-02-28 MED ORDER — METFORMIN HCL ER 500 MG PO TB24: 500.0000 mg | ORAL_TABLET | Freq: Every day | ORAL | Status: DC

## 2015-02-28 NOTE — Progress Notes (Signed)

## 2015-03-05 ENCOUNTER — Other Ambulatory Visit: Payer: Self-pay

## 2015-03-05 DIAGNOSIS — E119 Type 2 diabetes mellitus without complications: Secondary | ICD-10-CM

## 2015-03-05 NOTE — Telephone Encounter (Signed)
Received fax from Pharmacy

## 2015-03-05 NOTE — Telephone Encounter (Signed)
Refilled 02/28/15

## 2015-03-07 ENCOUNTER — Encounter: Payer: 59 | Admitting: *Deleted

## 2015-03-07 ENCOUNTER — Encounter: Payer: Self-pay | Admitting: *Deleted

## 2015-03-07 ENCOUNTER — Other Ambulatory Visit: Payer: Self-pay | Admitting: Family Medicine

## 2015-03-07 VITALS — Wt 220.5 lb

## 2015-03-07 DIAGNOSIS — E119 Type 2 diabetes mellitus without complications: Secondary | ICD-10-CM | POA: Diagnosis not present

## 2015-03-07 NOTE — Progress Notes (Signed)

## 2015-03-08 ENCOUNTER — Telehealth: Payer: Self-pay

## 2015-03-08 MED ORDER — THYROID 65 MG PO TABS: 65.0000 mg | ORAL_TABLET | Freq: Every day | ORAL | Status: DC

## 2015-03-08 NOTE — Telephone Encounter (Signed)
Done

## 2015-03-08 NOTE — Telephone Encounter (Signed)
Sent to [facility] 

## 2015-03-08 NOTE — Addendum Note (Signed)
Addended by: Schuyler Amor on: 03/08/2015 09:29 AM   Modules accepted: Orders

## 2015-03-08 NOTE — Telephone Encounter (Signed)
Was this for thyroid medication refill? If so, has been sent in.

## 2015-03-11 ENCOUNTER — Telehealth: Payer: Self-pay

## 2015-03-11 MED ORDER — GLUCOSE BLOOD VI STRP: ORAL_STRIP | Status: DC

## 2015-03-11 NOTE — Addendum Note (Signed)
Addended by: Schuyler Amor on: 03/11/2015 02:18 PM   Modules accepted: Orders

## 2015-03-11 NOTE — Telephone Encounter (Signed)
Sent to [facility] 

## 2015-03-14 ENCOUNTER — Encounter: Payer: 59 | Attending: Family Medicine | Admitting: Dietician

## 2015-03-14 VITALS — BP 150/78 | Ht 67.0 in | Wt 219.1 lb

## 2015-03-14 DIAGNOSIS — E119 Type 2 diabetes mellitus without complications: Secondary | ICD-10-CM | POA: Diagnosis present

## 2015-03-14 NOTE — Progress Notes (Signed)

## 2015-03-15 ENCOUNTER — Telehealth: Payer: Self-pay

## 2015-03-15 ENCOUNTER — Encounter: Payer: Self-pay | Admitting: Dietician

## 2015-03-15 ENCOUNTER — Other Ambulatory Visit: Payer: Self-pay

## 2015-03-15 NOTE — Telephone Encounter (Signed)
Thanks for taking care of this.

## 2015-03-19 ENCOUNTER — Other Ambulatory Visit: Payer: Self-pay | Admitting: Family Medicine

## 2015-03-19 MED ORDER — GLUCOSE BLOOD VI STRP: ORAL_STRIP | Status: DC

## 2015-03-22 ENCOUNTER — Other Ambulatory Visit: Payer: Self-pay | Admitting: Family Medicine

## 2015-03-26 ENCOUNTER — Telehealth: Payer: Self-pay

## 2015-03-26 NOTE — Telephone Encounter (Signed)
Take one capsule every other day for a week then every third day for a week then stop

## 2015-03-26 NOTE — Telephone Encounter (Signed)
Sent to [facility] 

## 2015-05-01 ENCOUNTER — Encounter: Payer: Self-pay | Admitting: Family Medicine

## 2015-05-01 ENCOUNTER — Ambulatory Visit (INDEPENDENT_AMBULATORY_CARE_PROVIDER_SITE_OTHER): Payer: 59 | Admitting: Family Medicine

## 2015-05-01 VITALS — BP 125/75 | HR 67 | Ht 67.0 in | Wt 214.2 lb

## 2015-05-01 DIAGNOSIS — F331 Major depressive disorder, recurrent, moderate: Secondary | ICD-10-CM

## 2015-05-01 DIAGNOSIS — E559 Vitamin D deficiency, unspecified: Secondary | ICD-10-CM

## 2015-05-01 DIAGNOSIS — E039 Hypothyroidism, unspecified: Secondary | ICD-10-CM | POA: Diagnosis not present

## 2015-05-01 DIAGNOSIS — H539 Unspecified visual disturbance: Secondary | ICD-10-CM | POA: Diagnosis not present

## 2015-05-01 DIAGNOSIS — E119 Type 2 diabetes mellitus without complications: Secondary | ICD-10-CM

## 2015-05-01 DIAGNOSIS — I10 Essential (primary) hypertension: Secondary | ICD-10-CM

## 2015-05-01 DIAGNOSIS — E669 Obesity, unspecified: Secondary | ICD-10-CM | POA: Diagnosis not present

## 2015-05-01 DIAGNOSIS — E66811 Obesity, class 1: Secondary | ICD-10-CM

## 2015-05-01 DIAGNOSIS — H9319 Tinnitus, unspecified ear: Secondary | ICD-10-CM | POA: Diagnosis not present

## 2015-05-01 DIAGNOSIS — K219 Gastro-esophageal reflux disease without esophagitis: Secondary | ICD-10-CM

## 2015-05-01 MED ORDER — SIMVASTATIN 40 MG PO TABS: 40.0000 mg | ORAL_TABLET | Freq: Every day | ORAL | Status: DC

## 2015-05-01 NOTE — Progress Notes (Signed)
Date:  05/01/2015   Name:  Audrey Harrison   DOB:  1950/08/09   MRN:  [id number]  PCP:  Schuyler Amor, MD    Chief Complaint: Follow-up; Depression; and Hypertension   History of Present Illness:  This is a 65 y.o. female for 3 month f/u. Stopped Effexor due to side effects, feeling a little down but not interested in starting another antidepressant. Also stopped vit D because getting in [facility]. Zyrtec helping allergies. Having some vision changes since starting Effexor which have not gone away, scheduled to see optho soon. Also c/o tinnitus for which she has seen ENT in past.  Review of Systems:  Review of Systems  Constitutional: Negative for fever.  Respiratory: Negative for cough and shortness of breath.   Cardiovascular: Negative for chest pain and leg swelling.  Endocrine: Negative for polyuria.  Genitourinary: Negative for difficulty urinating.  Neurological: Negative for syncope and light-headedness.    Patient Active Problem List   Diagnosis Date Noted  . Depression, major, recurrent (HCC) 11/30/2014  . GERD without esophagitis 11/30/2014  . Hypertension 10/30/2014  . Family history of breast cancer 06/08/2014  . Family history of premature coronary heart disease 06/08/2014  . Family history of macular degeneration 06/08/2014  . H/O alcohol abuse 06/08/2014  . History of colon polyps 06/08/2014  . H/O gastrointestinal disease 06/08/2014  . Adult hypothyroidism 06/08/2014  . Obesity, Class I, BMI 30-34.9 06/08/2014  . Diabetes mellitus type 2, controlled, without complications (HCC) 06/08/2014  . Vitamin D deficiency 06/08/2014    Prior to Admission medications   Medication Sig Start Date End Date Taking? Authorizing Provider  aspirin 81 MG tablet Take 1 tablet by mouth daily.   Yes Historical Provider, MD  cetirizine (ZYRTEC) 10 MG chewable tablet Chew 1 tablet by mouth daily as needed. Reported on 01/30/2015   Yes Historical Provider, MD  glucose blood (FREESTYLE  LITE) test strip Use daily as directed 03/19/15  Yes Schuyler Amor, MD  losartan-hydrochlorothiazide (HYZAAR) 100-12.5 MG per tablet Take 1 tablet by mouth daily. 10/09/14  Yes Schuyler Amor, MD  metFORMIN (GLUCOPHAGE-XR) 500 MG 24 hr tablet Take 1 tablet (500 mg total) by mouth daily. 02/28/15  Yes Schuyler Amor, MD  Multiple Vitamins-Minerals (OCUVITE ADULT 50+) CAPS Take 1 tablet by mouth daily.   Yes Historical Provider, MD  omeprazole (PRILOSEC) 40 MG capsule Take 1 capsule (40 mg total) by mouth daily. 10/09/14  Yes Schuyler Amor, MD  thyroid (ARMOUR) 65 MG tablet Take 1 tablet (65 mg total) by mouth daily. 03/08/15  Yes Schuyler Amor, MD  simvastatin (ZOCOR) 40 MG tablet Take 1 tablet (40 mg total) by mouth at bedtime. 05/01/15   Schuyler Amor, MD    No Known Allergies  Past Surgical History  Procedure Laterality Date  . Vaginal hysterectomy    . Colonoscopy  2012    cleared for 10 yrs- [place] MD    Social History  Substance Use Topics  . Smoking status: Never Smoker   . Smokeless tobacco: None  . Alcohol Use: No    Family History  Problem Relation Age of Onset  . Hypertension Mother   . Cancer Sister   . Hypertension Sister   . Cancer Brother   . Diabetes Paternal Grandmother   . Hypertension Paternal Grandmother     Medication list has been reviewed and updated.  Physical Examination: BP 125/75 mmHg  Pulse 67  Ht  (1.702 m)  Wt 214 lb 3.2 oz (97.16 kg)  BMI 33.54 kg/m2  Physical Exam  Constitutional: She appears well-developed and well-nourished.  HENT:  Right Ear: External ear normal.  Left Ear: External ear normal.  TM's clear  Cardiovascular: Normal rate, regular rhythm and normal heart sounds.   Pulmonary/Chest: Effort normal and breath sounds normal.  Musculoskeletal: She exhibits no edema.  Neurological: She is alert.  Skin: Skin is warm and dry.  Psychiatric: She has a normal mood and affect. Her behavior is normal.  Nursing note and vitals  reviewed.   Assessment and Plan:  1. Controlled type 2 diabetes mellitus without complication, without long-term current use of insulin (HCC) Discussed statin use given 10 yr CVR > 10%, has taken in past without difficulty, begin Zocor 40 mg daily - HgB A1c  2. Hypothyroidism, unspecified hypothyroidism type Dosage changed 4 months ago - TSH  3. GERD without esophagitis Discussed potential LT se's PPI use and rebound efffect, will attempt to wean off or change to Zantac  4. Essential hypertension Well controlled, cont Hyzaar  5. Obesity, Class I, BMI 30-34.9 Weight down 5#, cont weight loss  6. Vitamin D deficiency Off supplement, consider recheck level next visit  7. Moderate episode of recurrent major depressive disorder (HCC) Off Effexor due to se's, will monitor sxs, consider alternate antidepressant if worsen/persist   8. Visual changes F/u with optho as scheduled  9. Tinnitus, unspecified laterality Has seen ENT with no treatment offered, consider re-referral if sxs worsen  Return in about 3 months (around 08/01/2015).  Dionne AnoWilliam M. Kingsley SpittlePlonk, Jr. MD [facility]  05/01/2015

## 2015-05-02 LAB — HEMOGLOBIN A1C
Est. average glucose Bld gHb Est-mCnc: 137 mg/dL
Hgb A1c MFr Bld: 6.4 % — ABNORMAL HIGH (ref 4.8–5.6)

## 2015-05-02 LAB — TSH: TSH: 7.54 u[IU]/mL — ABNORMAL HIGH (ref 0.450–4.500)

## 2015-05-02 MED ORDER — LEVOTHYROXINE SODIUM 112 MCG PO TABS: 112.0000 ug | ORAL_TABLET | Freq: Every day | ORAL | Status: DC

## 2015-05-02 NOTE — Addendum Note (Signed)
Addended by: Schuyler AmorPLONK, Isac Lincks on: 05/02/2015 04:50 PM   Modules accepted: Orders, Medications, SmartSet

## 2015-05-07 ENCOUNTER — Other Ambulatory Visit: Payer: Self-pay

## 2015-08-02 ENCOUNTER — Encounter: Payer: Self-pay | Admitting: Family Medicine

## 2015-08-02 ENCOUNTER — Ambulatory Visit (INDEPENDENT_AMBULATORY_CARE_PROVIDER_SITE_OTHER): Payer: 59 | Admitting: Family Medicine

## 2015-08-02 VITALS — BP 118/78 | HR 76 | Resp 16 | Ht 67.0 in | Wt 214.0 lb

## 2015-08-02 DIAGNOSIS — E039 Hypothyroidism, unspecified: Secondary | ICD-10-CM

## 2015-08-02 DIAGNOSIS — E559 Vitamin D deficiency, unspecified: Secondary | ICD-10-CM

## 2015-08-02 DIAGNOSIS — E669 Obesity, unspecified: Secondary | ICD-10-CM | POA: Diagnosis not present

## 2015-08-02 DIAGNOSIS — E785 Hyperlipidemia, unspecified: Secondary | ICD-10-CM | POA: Diagnosis not present

## 2015-08-02 DIAGNOSIS — I10 Essential (primary) hypertension: Secondary | ICD-10-CM

## 2015-08-02 DIAGNOSIS — E119 Type 2 diabetes mellitus without complications: Secondary | ICD-10-CM

## 2015-08-02 DIAGNOSIS — K219 Gastro-esophageal reflux disease without esophagitis: Secondary | ICD-10-CM

## 2015-08-02 NOTE — Progress Notes (Signed)
Date:  08/02/2015   Name:  Audrey Harrison   DOB:  11-Sep-1950   MRN:  [id number]  PCP:  Schuyler AmorWilliam Alajah Witman, MD    Chief Complaint: Hyperlipidemia and Diabetes   History of Present Illness:  This is a 65 y.o. female seen for three month f/u. Started on Zocor last visit, c/o myalgias and fatigue, wants to stop. Also wants to stop Hyzaar as BP's well controlled every visit. Thyroid dose increased last visit. Was unsuccessful at weaning off PPI. Off vit D supplement, just taking Ocuvite.  Review of Systems:  Review of Systems  Constitutional: Negative for fever.  Respiratory: Negative for cough and shortness of breath.   Cardiovascular: Negative for chest pain and leg swelling.  Endocrine: Negative for polyuria.  Genitourinary: Negative for difficulty urinating.  Neurological: Negative for syncope and light-headedness.    Patient Active Problem List   Diagnosis Date Noted  . Hyperlipidemia 08/02/2015  . Depression, major, recurrent (HCC) 11/30/2014  . GERD without esophagitis 11/30/2014  . Hypertension 10/30/2014  . Family history of breast cancer 06/08/2014  . Family history of premature coronary heart disease 06/08/2014  . Family history of macular degeneration 06/08/2014  . H/O alcohol abuse 06/08/2014  . History of colon polyps 06/08/2014  . H/O gastrointestinal disease 06/08/2014  . Adult hypothyroidism 06/08/2014  . Obesity, Class I, BMI 30-34.9 06/08/2014  . Diabetes mellitus type 2, controlled, without complications (HCC) 06/08/2014  . Vitamin D deficiency 06/08/2014    Prior to Admission medications   Medication Sig Start Date End Date Taking? Authorizing Provider  aspirin 81 MG tablet Take 1 tablet by mouth daily.   Yes Historical Provider, MD  cetirizine (ZYRTEC) 10 MG chewable tablet Chew 1 tablet by mouth daily as needed. Reported on 01/30/2015   Yes Historical Provider, MD  glucose blood (FREESTYLE LITE) test strip Use daily as directed 03/19/15  Yes Schuyler AmorWilliam Jurney Overacker,  MD  levothyroxine (SYNTHROID, LEVOTHROID) 112 MCG tablet Take 1 tablet (112 mcg total) by mouth daily. 05/02/15  Yes Schuyler AmorWilliam Demitrios Molyneux, MD  metFORMIN (GLUCOPHAGE-XR) 500 MG 24 hr tablet Take 1 tablet (500 mg total) by mouth daily. 02/28/15  Yes Schuyler AmorWilliam Quay Simkin, MD  Multiple Vitamins-Minerals (OCUVITE ADULT 50+) CAPS Take 1 tablet by mouth daily.   Yes Historical Provider, MD  omeprazole (PRILOSEC) 40 MG capsule Take 1 capsule (40 mg total) by mouth daily. 10/09/14  Yes Schuyler AmorWilliam Charee Tumblin, MD    No Known Allergies  Past Surgical History  Procedure Laterality Date  . Vaginal hysterectomy    . Colonoscopy  2012    cleared for 10 yrs- [place]    Social History  Substance Use Topics  . Smoking status: Never Smoker   . Smokeless tobacco: None  . Alcohol Use: No    Family History  Problem Relation Age of Onset  . Hypertension Mother   . Cancer Sister   . Hypertension Sister   . Cancer Brother   . Diabetes Paternal Grandmother   . Hypertension Paternal Grandmother     Medication list has been reviewed and updated.  Physical Examination: BP 118/78 mmHg  Pulse 76  Resp 16  Ht 5\' 7"  (1.702 m)  Wt 214 lb (97.07 kg)  BMI 33.51 kg/m2  SpO2 98%  Physical Exam  Constitutional: She appears well-developed and well-nourished.  Cardiovascular: Normal rate, regular rhythm and normal heart sounds.   Pulmonary/Chest: Effort normal and breath sounds normal.  Musculoskeletal: She exhibits no edema.  Neurological: She is alert.  Skin: Skin  is warm and dry.  Psychiatric: She has a normal mood and affect. Her behavior is normal.  Nursing note and vitals reviewed.   Assessment and Plan:  1. Controlled type 2 diabetes mellitus without complication, without long-term current use of insulin (HCC) Well controlled on metformin XR - HgB A1c  2. Essential hypertension BP's well controlled and MCR normal past year, will try d/c Hyzaar, consider repeat MCR next visit  3. GERD without  esophagitis Unsuccessful taper of PPI, continue for now  4. Hypothyroidism, unspecified hypothyroidism type On increased dose Synthroid - TSH  5. Hyperlipidemia D/c Zocor due to se's  6. Vitamin D deficiency Off supplement - Vitamin D (25 hydroxy)  7. Obesity, Class I, BMI 30-34.9 Exercise/weight loss discussed  Return in about 3 months (around 11/02/2015).  Dionne AnoWilliam M. Kingsley SpittlePlonk, Jr. MD [facility]  08/02/2015

## 2015-08-03 LAB — HEMOGLOBIN A1C
Est. average glucose Bld gHb Est-mCnc: 134 mg/dL
Hgb A1c MFr Bld: 6.3 % — ABNORMAL HIGH (ref 4.8–5.6)

## 2015-08-03 LAB — VITAMIN D 25 HYDROXY (VIT D DEFICIENCY, FRACTURES): VIT D 25 HYDROXY: 44.2 ng/mL (ref 30.0–100.0)

## 2015-08-03 LAB — TSH: TSH: 0.085 u[IU]/mL — AB (ref 0.450–4.500)

## 2015-08-05 MED ORDER — LEVOTHYROXINE SODIUM 100 MCG PO TABS: 100.0000 ug | ORAL_TABLET | Freq: Every day | ORAL | Status: DC

## 2015-08-05 NOTE — Addendum Note (Signed)
Addended by: Schuyler AmorPLONK, Kym Scannell on: 08/05/2015 08:27 AM   Modules accepted: Orders, Medications, SmartSet

## 2015-10-02 ENCOUNTER — Other Ambulatory Visit: Payer: Self-pay | Admitting: Family Medicine

## 2015-10-02 DIAGNOSIS — Z8719 Personal history of other diseases of the digestive system: Secondary | ICD-10-CM

## 2015-11-04 ENCOUNTER — Ambulatory Visit: Payer: Self-pay | Admitting: Family Medicine

## 2015-11-04 ENCOUNTER — Other Ambulatory Visit: Payer: Self-pay | Admitting: Family Medicine

## 2015-11-05 ENCOUNTER — Ambulatory Visit: Payer: Self-pay | Admitting: Family Medicine

## 2015-12-02 ENCOUNTER — Other Ambulatory Visit: Payer: Self-pay

## 2015-12-04 ENCOUNTER — Other Ambulatory Visit: Payer: Self-pay | Admitting: Internal Medicine

## 2015-12-04 ENCOUNTER — Encounter: Payer: Self-pay | Admitting: Internal Medicine

## 2015-12-04 ENCOUNTER — Ambulatory Visit (INDEPENDENT_AMBULATORY_CARE_PROVIDER_SITE_OTHER): Payer: 59 | Admitting: Internal Medicine

## 2015-12-04 VITALS — BP 130/82 | HR 68 | Resp 16 | Ht 67.0 in | Wt 217.0 lb

## 2015-12-04 DIAGNOSIS — Z8719 Personal history of other diseases of the digestive system: Secondary | ICD-10-CM | POA: Diagnosis not present

## 2015-12-04 DIAGNOSIS — E039 Hypothyroidism, unspecified: Secondary | ICD-10-CM

## 2015-12-04 DIAGNOSIS — E119 Type 2 diabetes mellitus without complications: Secondary | ICD-10-CM | POA: Diagnosis not present

## 2015-12-04 DIAGNOSIS — Z1239 Encounter for other screening for malignant neoplasm of breast: Secondary | ICD-10-CM

## 2015-12-04 DIAGNOSIS — K2100 Gastro-esophageal reflux disease with esophagitis, without bleeding: Secondary | ICD-10-CM

## 2015-12-04 DIAGNOSIS — Z1231 Encounter for screening mammogram for malignant neoplasm of breast: Secondary | ICD-10-CM | POA: Diagnosis not present

## 2015-12-04 DIAGNOSIS — E782 Mixed hyperlipidemia: Secondary | ICD-10-CM | POA: Diagnosis not present

## 2015-12-04 DIAGNOSIS — I10 Essential (primary) hypertension: Secondary | ICD-10-CM

## 2015-12-04 MED ORDER — OMEPRAZOLE 40 MG PO CPDR
DELAYED_RELEASE_CAPSULE | ORAL | 0 refills | Status: DC
Start: 2015-12-04 — End: 2016-01-04

## 2015-12-04 NOTE — Progress Notes (Signed)
Date:  12/04/2015   Name:  Audrey Harrison   DOB:  22-Jul-1950   MRN:  [id number]   Chief Complaint: Diabetes and Hypothyroidism Diabetes  She presents for her follow-up diabetic visit. She has type 2 diabetes mellitus. Her disease course has been stable. Pertinent negatives for hypoglycemia include no headaches or tremors. Pertinent negatives for diabetes include no chest pain, no fatigue, no polydipsia, no polyuria, no visual change and no weight loss. Current diabetic treatment includes oral agent (monotherapy). She is compliant with treatment most of the time. Her weight is stable. She is following a generally healthy diet. When asked about meal planning, she reported none. She has not had a previous visit with a dietitian. She participates in exercise intermittently. Frequency home blood tests: tests rarely. Her breakfast blood glucose is taken between 7-8 am. Her breakfast blood glucose range is generally 90-110 mg/dl. An ACE inhibitor/angiotensin II receptor blocker is not being taken. She does not see a podiatrist.Eye exam is current (due in December).  Thyroid Problem  Presents for follow-up visit. Patient reports no constipation, depressed mood, fatigue, palpitations, tremors, visual change or weight loss. (Dose decreased last visit) The symptoms have been stable.    Lab Results  Component Value Date   HGBA1C 6.3 (H) 08/02/2015   Lab Results  Component Value Date   CREATININE 0.96 10/30/2014   Lab Results  Component Value Date   TSH 0.085 (L) 08/02/2015     Review of Systems  Constitutional: Negative for appetite change, fatigue, fever, unexpected weight change and weight loss.  HENT: Negative for tinnitus and trouble swallowing.   Eyes: Negative for visual disturbance.  Respiratory: Negative for cough, chest tightness and shortness of breath.   Cardiovascular: Negative for chest pain, palpitations and leg swelling.  Gastrointestinal: Negative for abdominal pain and  constipation.  Endocrine: Negative for polydipsia and polyuria.  Genitourinary: Negative for dysuria and hematuria.  Musculoskeletal: Negative for arthralgias.  Neurological: Negative for tremors, numbness and headaches.  Psychiatric/Behavioral: Negative for dysphoric mood.    Patient Active Problem List   Diagnosis Date Noted  . Hyperlipidemia 08/02/2015  . Depression, major, recurrent (HCC) 11/30/2014  . GERD without esophagitis 11/30/2014  . Hypertension 10/30/2014  . Family history of breast cancer 06/08/2014  . Family history of premature coronary heart disease 06/08/2014  . Family history of macular degeneration 06/08/2014  . H/O alcohol abuse 06/08/2014  . History of colon polyps 06/08/2014  . H/O gastrointestinal disease 06/08/2014  . Adult hypothyroidism 06/08/2014  . Obesity, Class I, BMI 30-34.9 06/08/2014  . Diabetes mellitus type 2, controlled, without complications (HCC) 06/08/2014  . Vitamin D deficiency 06/08/2014    Prior to Admission medications   Medication Sig Start Date End Date Taking? Authorizing Provider  aspirin 81 MG tablet Take 1 tablet by mouth daily.   Yes Historical Provider, MD  cetirizine (ZYRTEC) 10 MG chewable tablet Chew 1 tablet by mouth daily as needed. Reported on 01/30/2015   Yes Historical Provider, MD  glucose blood (FREESTYLE LITE) test strip Use daily as directed 03/19/15  Yes Schuyler Amor, MD  levothyroxine (SYNTHROID, LEVOTHROID) 100 MCG tablet TAKE ONE TABLET BY MOUTH ONCE DAILY 11/04/15  Yes Duanne Limerick, MD  metFORMIN (GLUCOPHAGE-XR) 500 MG 24 hr tablet Take 1 tablet (500 mg total) by mouth daily. 02/28/15  Yes Schuyler Amor, MD  Multiple Vitamins-Minerals (OCUVITE ADULT 50+) CAPS Take 1 tablet by mouth daily.   Yes Historical Provider, MD  omeprazole (PRILOSEC) 40  MG capsule TAKE ONE CAPSULE BY MOUTH ONCE DAILY Patient taking differently: TAKE ONE CAPSULE BY MOUTH as needed 10/02/15  Yes Schuyler AmorWilliam Plonk, MD    Allergies  Allergen  Reactions  . Zocor [Simvastatin] Other (See Comments)    Myalgias    Past Surgical History:  Procedure Laterality Date  . COLONOSCOPY  2012   cleared for 10 yrs- [place]  . VAGINAL HYSTERECTOMY      Social History  Substance Use Topics  . Smoking status: Never Smoker  . Smokeless tobacco: Never Used  . Alcohol use No     Medication list has been reviewed and updated.   Physical Exam  Constitutional: She is oriented to person, place, and time. She appears well-developed. No distress.  HENT:  Head: Normocephalic and atraumatic.  Cardiovascular: Normal rate, regular rhythm and normal heart sounds.   Pulmonary/Chest: Effort normal and breath sounds normal. No respiratory distress.  Musculoskeletal: Normal range of motion. She exhibits no edema or tenderness.  Neurological: She is alert and oriented to person, place, and time.  Skin: Skin is warm and dry. No rash noted.  Psychiatric: She has a normal mood and affect. Her behavior is normal. Thought content normal.  Nursing note and vitals reviewed.   BP 130/82   Pulse 68   Resp 16   Ht 5\' 7"  (1.702 m)   Wt 217 lb (98.4 kg)   SpO2 98%   BMI 33.99 kg/m   Assessment and Plan: 1. Essential hypertension Stable on no medications at this time  2. Controlled type 2 diabetes mellitus without complication, without long-term current use of insulin (HCC) Doing well on single agent Statin stopped due to myalgias Encouraged regular exercise and weight loss - Hemoglobin A1c  3. Adult hypothyroidism Dose changed recently - TSH  4. Breast cancer screening - MM DIGITAL SCREENING BILATERAL; Future  5. History of esophageal stricture Sx stable on PRN PPI Should consider EGD to monitor for Barrett's - omeprazole (PRILOSEC) 40 MG capsule; TAKE ONE CAPSULE BY MOUTH daily  Dispense: 30 capsule; Refill: 0   Bari EdwardLaura Berglund, MD [facility]  12/04/2015

## 2015-12-04 NOTE — Patient Instructions (Signed)
Breast Self-Awareness Practicing breast self-awareness may pick up problems early, prevent significant medical complications, and possibly save your life. By practicing breast self-awareness, you can become familiar with how your breasts look and feel and if your breasts are changing. This allows you to notice changes early. It can also offer you some reassurance that your breast health is good. One way to learn what is normal for your breasts and whether your breasts are changing is to do a breast self-exam. If you find a lump or something that was not present in the past, it is best to contact your caregiver right away. Other findings that should be evaluated by your caregiver include nipple discharge, especially if it is bloody; skin changes or reddening; areas where the skin seems to be pulled in (retracted); or new lumps and bumps. Breast pain is seldom associated with cancer (malignancy), but should also be evaluated by a caregiver. HOW TO PERFORM A BREAST SELF-EXAM The best time to examine your breasts is 5-7 days after your menstrual period is over. During menstruation, the breasts are lumpier, and it may be more difficult to pick up changes. If you do not menstruate, have reached menopause, or had your uterus removed (hysterectomy), you should examine your breasts at regular intervals, such as monthly. If you are breastfeeding, examine your breasts after a feeding or after using a breast pump. Breast implants do not decrease the risk for lumps or tumors, so continue to perform breast self-exams as recommended. Talk to your caregiver about how to determine the difference between the implant and breast tissue. Also, talk about the amount of pressure you should use during the exam. Over time, you will become more familiar with the variations of your breasts and more comfortable with the exam. A breast self-exam requires you to remove all your clothes above the waist. 1. Look at your breasts and nipples.  Stand in front of a mirror in a room with good lighting. With your hands on your hips, push your hands firmly downward. Look for a difference in shape, contour, and size from one breast to the other (asymmetry). Asymmetry includes puckers, dips, or bumps. Also, look for skin changes, such as reddened or scaly areas on the breasts. Look for nipple changes, such as discharge, dimpling, repositioning, or redness. 2. Carefully feel your breasts. This is best done either in the shower or tub while using soapy water or when flat on your back. Place the arm (on the side of the breast you are examining) above your head. Use the pads (not the fingertips) of your three middle fingers on your opposite hand to feel your breasts. Start in the underarm area and use  inch (2 cm) overlapping circles to feel your breast. Use 3 different levels of pressure (light, medium, and firm pressure) at each circle before moving to the next circle. The light pressure is needed to feel the tissue closest to the skin. The medium pressure will help to feel breast tissue a little deeper, while the firm pressure is needed to feel the tissue close to the ribs. Continue the overlapping circles, moving downward over the breast until you feel your ribs below your breast. Then, move one finger-width towards the center of the body. Continue to use the  inch (2 cm) overlapping circles to feel your breast as you move slowly up toward the collar bone (clavicle) near the base of the neck. Continue the up and down exam using all 3 pressures until you reach the   middle of the chest. Do this with each breast, carefully feeling for lumps or changes. 3.  Keep a written record with breast changes or normal findings for each breast. By writing this information down, you do not need to depend only on memory for size, tenderness, or location. Write down where you are in your menstrual cycle, if you are still menstruating. Breast tissue can have some lumps or  thick tissue. However, see your caregiver if you find anything that concerns you.  SEEK MEDICAL CARE IF:  You see a change in shape, contour, or size of your breasts or nipples.   You see skin changes, such as reddened or scaly areas on the breasts or nipples.   You have an unusual discharge from your nipples.   You feel a new lump or unusually thick areas.    This information is not intended to replace advice given to you by your health care provider. Make sure you discuss any questions you have with your health care provider.   Document Released: 01/26/2005 Document Revised: 01/13/2012 Document Reviewed: 05/13/2011 Elsevier Interactive Patient Education 2016 [employer]  

## 2015-12-05 LAB — TSH: TSH: 0.085 u[IU]/mL — ABNORMAL LOW (ref 0.450–4.500)

## 2015-12-05 LAB — HEMOGLOBIN A1C
ESTIMATED AVERAGE GLUCOSE: 128 mg/dL
Hgb A1c MFr Bld: 6.1 % — ABNORMAL HIGH (ref 4.8–5.6)

## 2015-12-07 ENCOUNTER — Other Ambulatory Visit: Payer: Self-pay | Admitting: Family Medicine

## 2015-12-10 ENCOUNTER — Other Ambulatory Visit: Payer: Self-pay | Admitting: Family Medicine

## 2016-01-04 ENCOUNTER — Other Ambulatory Visit: Payer: Self-pay | Admitting: Internal Medicine

## 2016-01-04 DIAGNOSIS — Z8719 Personal history of other diseases of the digestive system: Secondary | ICD-10-CM

## 2016-03-26 ENCOUNTER — Other Ambulatory Visit: Payer: Self-pay | Admitting: Family Medicine

## 2016-03-26 DIAGNOSIS — E119 Type 2 diabetes mellitus without complications: Secondary | ICD-10-CM

## 2016-06-03 ENCOUNTER — Encounter: Payer: Self-pay | Admitting: Family Medicine

## 2016-06-03 ENCOUNTER — Ambulatory Visit (INDEPENDENT_AMBULATORY_CARE_PROVIDER_SITE_OTHER): Payer: 59 | Admitting: Family Medicine

## 2016-06-03 VITALS — BP 140/82 | HR 68 | Resp 16 | Ht 67.0 in | Wt 229.0 lb

## 2016-06-03 DIAGNOSIS — K2100 Gastro-esophageal reflux disease with esophagitis, without bleeding: Secondary | ICD-10-CM

## 2016-06-03 DIAGNOSIS — J309 Allergic rhinitis, unspecified: Secondary | ICD-10-CM | POA: Insufficient documentation

## 2016-06-03 DIAGNOSIS — Z1239 Encounter for other screening for malignant neoplasm of breast: Secondary | ICD-10-CM

## 2016-06-03 DIAGNOSIS — E559 Vitamin D deficiency, unspecified: Secondary | ICD-10-CM

## 2016-06-03 DIAGNOSIS — E669 Obesity, unspecified: Secondary | ICD-10-CM

## 2016-06-03 DIAGNOSIS — I10 Essential (primary) hypertension: Secondary | ICD-10-CM

## 2016-06-03 DIAGNOSIS — Z1231 Encounter for screening mammogram for malignant neoplasm of breast: Secondary | ICD-10-CM

## 2016-06-03 DIAGNOSIS — E039 Hypothyroidism, unspecified: Secondary | ICD-10-CM

## 2016-06-03 DIAGNOSIS — E119 Type 2 diabetes mellitus without complications: Secondary | ICD-10-CM

## 2016-06-03 DIAGNOSIS — F331 Major depressive disorder, recurrent, moderate: Secondary | ICD-10-CM

## 2016-06-03 MED ORDER — OMEPRAZOLE 40 MG PO CPDR
40.0000 mg | DELAYED_RELEASE_CAPSULE | Freq: Every day | ORAL | 1 refills | Status: DC | PRN
Start: 2016-06-03 — End: 2017-05-18

## 2016-06-03 MED ORDER — GLUCOSE BLOOD VI STRP: ORAL_STRIP | 5 refills | Status: DC

## 2016-06-03 MED ORDER — METFORMIN HCL ER 500 MG PO TB24: 500.0000 mg | ORAL_TABLET | Freq: Every day | ORAL | 3 refills | Status: DC

## 2016-06-03 MED ORDER — VITAMIN D3 25 MCG (1000 UT) PO CAPS: 1.0000 | ORAL_CAPSULE | Freq: Every day | ORAL | Status: DC

## 2016-06-03 NOTE — Progress Notes (Signed)
Date:  06/03/2016   Name:  Audrey Harrison   DOB:  1951-01-22   MRN:  [id number]  PCP:  Schuyler Amor, MD    Chief Complaint: Diabetes   History of Present Illness:  This is a 66 y.o. female seen for 6 month f/u. No new concerns, off Zocor and Effexor, stopped mvit but restarted vit D supplement. T2DM on metformin XR, BGs 90-110 at home. GERD with hx stricture, taking Prilosec more often now, about 3x/wk, no EGD in 5-10 yrs. TSH low last visit but Synthroid dose kept the same as asymptomatic. Mood ok off Effexor, comes and goes. Occ BLE edema, improves overnight. AR sxs worse now during pollen season, may try another brand. Never got mammo ordered last visit, none since 2013, FH breast ca. Has not seen optho in 2 yrs. Weight up, appetite the same, not exercising.  Review of Systems:  Review of Systems  Constitutional: Negative for activity change, appetite change, chills and fever.  Respiratory: Negative for cough and shortness of breath.   Cardiovascular: Negative for chest pain and palpitations.  Gastrointestinal: Negative for abdominal pain.  Endocrine: Negative for polydipsia and polyuria.  Genitourinary: Negative for difficulty urinating.  Neurological: Negative for syncope and light-headedness.    Patient Active Problem List   Diagnosis Date Noted  . Allergic rhinitis 06/03/2016  . Hyperlipidemia 08/02/2015  . Depression, major, recurrent (HCC) 11/30/2014  . GERD with esophagitis 11/30/2014  . Hypertension 10/30/2014  . Family history of breast cancer 06/08/2014  . Family history of premature coronary heart disease 06/08/2014  . Family history of macular degeneration 06/08/2014  . H/O alcohol abuse 06/08/2014  . History of colon polyps 06/08/2014  . Adult hypothyroidism 06/08/2014  . Obesity, Class I, BMI 30-34.9 06/08/2014  . Diabetes mellitus type 2, controlled, without complications (HCC) 06/08/2014  . Vitamin D deficiency 06/08/2014    Prior to Admission  medications   Medication Sig Start Date End Date Taking? Authorizing Provider  aspirin 81 MG tablet Take 1 tablet by mouth daily.   Yes Historical Provider, MD  cetirizine (ZYRTEC) 10 MG chewable tablet Chew 1 tablet by mouth daily as needed. Reported on 01/30/2015   Yes Historical Provider, MD  glucose blood (FREESTYLE LITE) test strip Use daily as directed 06/03/16  Yes Schuyler Amor, MD  levothyroxine (SYNTHROID, LEVOTHROID) 100 MCG tablet Take 1 tablet (100 mcg total) by mouth daily before breakfast. 01/04/16  Yes Reubin Milan, MD  metFORMIN (GLUCOPHAGE-XR) 500 MG 24 hr tablet Take 1 tablet (500 mg total) by mouth daily. 06/03/16  Yes Schuyler Amor, MD  omeprazole (PRILOSEC) 40 MG capsule Take 1 capsule (40 mg total) by mouth daily as needed. 06/03/16  Yes Schuyler Amor, MD  Cholecalciferol (VITAMIN D3) 1000 units CAPS Take 1 capsule (1,000 Units total) by mouth daily. 06/03/16   Schuyler Amor, MD    Allergies  Allergen Reactions  . Zocor [Simvastatin] Other (See Comments)    Myalgias    Past Surgical History:  Procedure Laterality Date  . COLONOSCOPY  2012   cleared for 10 yrs- [place] MD  . VAGINAL HYSTERECTOMY      Social History  Substance Use Topics  . Smoking status: Never Smoker  . Smokeless tobacco: Never Used  . Alcohol use No    Family History  Problem Relation Age of Onset  . Hypertension Mother   . Cancer Sister   . Hypertension Sister   . Cancer Brother   . Diabetes Paternal Grandmother   .  Hypertension Paternal Grandmother     Medication list has been reviewed and updated.  Physical Examination: BP 140/82   Pulse 68   Resp 16   Ht  (1.702 m)   Wt 229 lb (103.9 kg)   SpO2 98%   BMI 35.87 kg/m   Physical Exam  Constitutional: She appears well-developed and well-nourished.  Cardiovascular: Normal rate, regular rhythm and normal heart sounds.   Pulmonary/Chest: Effort normal and breath sounds normal.  Musculoskeletal: She exhibits no edema.   Neurological: She is alert.  Skin: Skin is warm and dry.  Psychiatric: She has a normal mood and affect. Her behavior is normal.  Nursing note and vitals reviewed.   Assessment and Plan:  1. Controlled type 2 diabetes mellitus without complication, without long-term current use of insulin (HCC) Well controlled, last a1c 6.1% in Oct, cont asa, intolerant statin - HgB A1c - Urine Microalbumin w/creat. ratio - Ambulatory referral to Ophthalmology  2. Essential hypertension Adequate control off Hyzaar  3. GERD with esophagitis Hx stricture, no EGD in 5 yrs - Ambulatory referral to Gastroenterology  4. Adult hypothyroidism On Synthroid - TSH  5. Obesity, Class I, BMI 30-34.9 Weight loss/exercise discussed, not interested in MNT referral  6. Vitamin D deficiency On supplement - Vitamin D (25 hydroxy)  7. Moderate episode of recurrent major depressive disorder (HCC) Stable off Effexor  8. Allergic rhinitis, unspecified seasonality, unspecified trigger Ok to try alternate OTC antihistamine brand  9. Breast cancer screening FH breast ca - MM Digital Screening; Future  Return in about 6 months (around 12/03/2016).  [name] MD [facility]  06/03/2016

## 2016-06-04 ENCOUNTER — Encounter: Payer: Self-pay | Admitting: Gastroenterology

## 2016-06-04 LAB — HEMOGLOBIN A1C
ESTIMATED AVERAGE GLUCOSE: 126 mg/dL
Hgb A1c MFr Bld: 6 % — ABNORMAL HIGH (ref 4.8–5.6)

## 2016-06-04 LAB — MICROALBUMIN / CREATININE URINE RATIO
CREATININE, UR: 51.3 mg/dL
Microalb/Creat Ratio: <5.8 mg/g creat (ref 0.0–30.0)

## 2016-06-04 LAB — VITAMIN D 25 HYDROXY (VIT D DEFICIENCY, FRACTURES): VIT D 25 HYDROXY: 34.7 ng/mL (ref 30.0–100.0)

## 2016-06-04 LAB — TSH: TSH: 0.352 u[IU]/mL — ABNORMAL LOW (ref 0.450–4.500)

## 2016-07-16 ENCOUNTER — Other Ambulatory Visit: Payer: Self-pay | Admitting: Internal Medicine

## 2016-12-03 ENCOUNTER — Encounter: Payer: Self-pay | Admitting: Family Medicine

## 2016-12-03 ENCOUNTER — Ambulatory Visit (INDEPENDENT_AMBULATORY_CARE_PROVIDER_SITE_OTHER): Payer: 59 | Admitting: Family Medicine

## 2016-12-03 VITALS — BP 128/82 | HR 68 | Resp 16 | Ht 67.0 in | Wt 229.0 lb

## 2016-12-03 DIAGNOSIS — E785 Hyperlipidemia, unspecified: Secondary | ICD-10-CM

## 2016-12-03 DIAGNOSIS — E119 Type 2 diabetes mellitus without complications: Secondary | ICD-10-CM | POA: Diagnosis not present

## 2016-12-03 DIAGNOSIS — L309 Dermatitis, unspecified: Secondary | ICD-10-CM

## 2016-12-03 DIAGNOSIS — E039 Hypothyroidism, unspecified: Secondary | ICD-10-CM | POA: Diagnosis not present

## 2016-12-03 DIAGNOSIS — E559 Vitamin D deficiency, unspecified: Secondary | ICD-10-CM | POA: Diagnosis not present

## 2016-12-03 DIAGNOSIS — Z23 Encounter for immunization: Secondary | ICD-10-CM

## 2016-12-03 NOTE — Patient Instructions (Signed)

## 2016-12-03 NOTE — Progress Notes (Signed)
Date:  12/03/2016   Name:  Audrey Harrison   DOB:  06/01/1950   MRN:  [id number]  PCP:  Schuyler AmorPlonk, Camryn Lampson, MD    Chief Complaint: Diabetes and Rash (Left arm itching after poison ivy episode 15-16 years ago and now this hand will not stop itching. )   History of Present Illness:  This is a 66 y.o. female seen for 6 month f/u. Home BGs less than 150. Using Prilosec only once weekly, no dysphagia. Taking Synthroid and vit D. Feels mood ok off Effexor, frustrated with working night shifts. AR ok on Zyrtec prn. Never got mammo or saw GI. C/o rash BUE L>R itches, comes and goes. Some BLE edema improves with sleeping. Saw optho in past year.   Review of Systems:  Review of Systems  Constitutional: Negative for chills and fever.  Respiratory: Negative for cough and shortness of breath.   Cardiovascular: Negative for chest pain and leg swelling.  Genitourinary: Negative for difficulty urinating.  Neurological: Negative for syncope and light-headedness.    Patient Active Problem List   Diagnosis Date Noted  . Allergic rhinitis 06/03/2016  . Hyperlipidemia 08/02/2015  . Depression, major, recurrent (HCC) 11/30/2014  . GERD with esophagitis 11/30/2014  . Hypertension 10/30/2014  . Family history of breast cancer 06/08/2014  . Family history of premature coronary heart disease 06/08/2014  . Family history of macular degeneration 06/08/2014  . H/O alcohol abuse 06/08/2014  . History of colon polyps 06/08/2014  . Adult hypothyroidism 06/08/2014  . Obesity, Class I, BMI 30-34.9 06/08/2014  . Diabetes mellitus type 2, controlled, without complications (HCC) 06/08/2014  . Vitamin D deficiency 06/08/2014    Prior to Admission medications   Medication Sig Start Date End Date Taking? Authorizing Provider  aspirin 81 MG tablet Take 1 tablet by mouth daily.   Yes [provider]  cetirizine (ZYRTEC) 10 MG chewable tablet Chew 1 tablet by mouth daily as needed. Reported on 01/30/2015    Yes [provider]  Cholecalciferol (VITAMIN D3) 1000 units CAPS Take 1 capsule (1,000 Units total) by mouth daily. 06/03/16  Yes Toma Arts, Chrissie NoaWilliam, MD  glucose blood (FREESTYLE LITE) test strip Use daily as directed 06/03/16  Yes Hindy Perrault, Chrissie NoaWilliam, MD  levothyroxine (SYNTHROID, LEVOTHROID) 100 MCG tablet TAKE ONE TABLET BY MOUTH ONCE DAILY BEFORE  BREAKFAST 07/17/16  Yes Adina Puzzo, Chrissie NoaWilliam, MD  metFORMIN (GLUCOPHAGE-XR) 500 MG 24 hr tablet Take 1 tablet (500 mg total) by mouth daily. 06/03/16  Yes Rachael Zapanta, Chrissie NoaWilliam, MD  omeprazole (PRILOSEC) 40 MG capsule Take 1 capsule (40 mg total) by mouth daily as needed. 06/03/16  Yes Estrellita Lasky, Chrissie NoaWilliam, MD    Allergies  Allergen Reactions  . Zocor [Simvastatin] Other (See Comments)    Myalgias    Past Surgical History:  Procedure Laterality Date  . COLONOSCOPY  2012   cleared [place] 10 yrs- [place]  . VAGINAL HYSTERECTOMY      Social History  Substance Use Topics  . Smoking status: Never Smoker  . Smokeless tobacco: Never Used  . Alcohol use No    Family History  Problem Relation Age of Onset  . Hypertension Mother   . Cancer Sister   . Hypertension Sister   . Cancer Brother   . Diabetes Paternal Grandmother   . Hypertension Paternal Grandmother     Medication list has been reviewed and updated.  Physical Examination: BP 128/82   Pulse 68   Resp 16   Ht 5\' 7"  (1.702 m)   Wt  229 lb (103.9 kg)   SpO2 99%   BMI 35.87 kg/m   Physical Exam  Constitutional: She appears well-developed and well-nourished.  Cardiovascular: Normal rate, regular rhythm and normal heart sounds.   Pulmonary/Chest: Effort normal and breath sounds normal.  Musculoskeletal:  Trace BLE edema  Neurological: She is alert.  Skin: Skin is warm and dry.  Eczematous rash over BUE L>R  Psychiatric: She has a normal mood and affect. Her behavior is normal.  Nursing note and vitals reviewed.   Assessment and Plan:  1. Controlled type 2 diabetes mellitus without  complication, without long-term current use of insulin (HCC) Well controlled in April on Glucophage XR, MCR ok, sees optho - HgB A1c - Renal Function Panel  2. Adult hypothyroidism On Synthroid - TSH  3. Vitamin D deficiency On supplement - Vitamin D (25 hydroxy)  4. Hyperlipidemia, unspecified hyperlipidemia type Intolerant statins, cont asa  5. Eczema, unspecified type Lubricants bid and after shower/bath, OTC HC cream prn, call if worsens/persists  6. Need for influenza vaccination - Flu Vaccine QUAD 6+ mos PF IM (Fluarix Quad PF)  Return in about 6 months (around 06/03/2017).  [name] MD [facility]  12/03/2016

## 2016-12-04 ENCOUNTER — Other Ambulatory Visit: Payer: Self-pay | Admitting: Family Medicine

## 2016-12-04 LAB — RENAL FUNCTION PANEL
ALBUMIN: 4.2 g/dL (ref 3.6–4.8)
BUN/Creatinine Ratio: 16 (ref 12–28)
BUN: 15 mg/dL (ref 8–27)
CO2: 23 mmol/L (ref 20–29)
CREATININE: 0.92 mg/dL (ref 0.57–1.00)
Calcium: 9.8 mg/dL (ref 8.7–10.3)
Chloride: 102 mmol/L (ref 96–106)
GFR, EST AFRICAN AMERICAN: 76 mL/min/{1.73_m2} (ref >59)
GFR, EST NON AFRICAN AMERICAN: 66 mL/min/{1.73_m2} (ref >59)
GLUCOSE: 97 mg/dL (ref 65–99)
Phosphorus: 3.3 mg/dL (ref 2.5–4.5)
Potassium: 4.7 mmol/L (ref 3.5–5.2)
Sodium: 142 mmol/L (ref 134–144)

## 2016-12-04 LAB — HEMOGLOBIN A1C
Est. average glucose Bld gHb Est-mCnc: 128 mg/dL
HEMOGLOBIN A1C: 6.1 % — AB (ref 4.8–5.6)

## 2016-12-04 LAB — TSH: TSH: 0.166 u[IU]/mL — AB (ref 0.450–4.500)

## 2016-12-04 LAB — VITAMIN D 25 HYDROXY (VIT D DEFICIENCY, FRACTURES): Vit D, 25-Hydroxy: 30.8 ng/mL (ref 30.0–100.0)

## 2016-12-04 MED ORDER — LEVOTHYROXINE SODIUM 88 MCG PO TABS: 88.0000 ug | ORAL_TABLET | Freq: Every day | ORAL | 3 refills | Status: DC

## 2016-12-14 ENCOUNTER — Telehealth: Payer: Self-pay

## 2016-12-14 ENCOUNTER — Other Ambulatory Visit: Payer: Self-pay | Admitting: Family Medicine

## 2016-12-14 MED ORDER — LEVOTHYROXINE SODIUM 88 MCG PO TABS: 88.0000 ug | ORAL_TABLET | Freq: Every day | ORAL | 3 refills | Status: DC

## 2016-12-14 NOTE — Telephone Encounter (Signed)
Patient called Friday leaving message saying that Dr Hollace HaywardPlonk changed her dose of Levothyroxine to a different milligram. Never received the Rx. She states she uses [facility] in [place]. Dr Hollace HaywardPlonk sent the medication to [facility]. I called patient and left VM to inform it was sent to [facility]. If she needs it to go to a different pharmacy please call back and let Asher MuirJamie know to confirm. Awaiting call back.

## 2017-01-15 ENCOUNTER — Other Ambulatory Visit: Payer: Self-pay | Admitting: Internal Medicine

## 2017-01-15 DIAGNOSIS — Z8719 Personal history of other diseases of the digestive system: Secondary | ICD-10-CM

## 2017-03-14 ENCOUNTER — Other Ambulatory Visit: Payer: Self-pay | Admitting: Family Medicine

## 2017-03-14 DIAGNOSIS — Z8719 Personal history of other diseases of the digestive system: Secondary | ICD-10-CM

## 2017-03-15 ENCOUNTER — Other Ambulatory Visit: Payer: Self-pay | Admitting: Family Medicine

## 2017-04-15 DIAGNOSIS — R69 Illness, unspecified: Secondary | ICD-10-CM | POA: Diagnosis not present

## 2017-05-18 ENCOUNTER — Ambulatory Visit (INDEPENDENT_AMBULATORY_CARE_PROVIDER_SITE_OTHER): Payer: Medicare HMO | Admitting: Family Medicine

## 2017-05-18 ENCOUNTER — Encounter: Payer: Self-pay | Admitting: Family Medicine

## 2017-05-18 VITALS — BP 122/80 | HR 68 | Resp 16 | Ht 67.0 in | Wt 226.2 lb

## 2017-05-18 DIAGNOSIS — E669 Obesity, unspecified: Secondary | ICD-10-CM

## 2017-05-18 DIAGNOSIS — K2100 Gastro-esophageal reflux disease with esophagitis, without bleeding: Secondary | ICD-10-CM

## 2017-05-18 DIAGNOSIS — E119 Type 2 diabetes mellitus without complications: Secondary | ICD-10-CM

## 2017-05-18 DIAGNOSIS — J309 Allergic rhinitis, unspecified: Secondary | ICD-10-CM

## 2017-05-18 DIAGNOSIS — Z0000 Encounter for general adult medical examination without abnormal findings: Secondary | ICD-10-CM

## 2017-05-18 DIAGNOSIS — E039 Hypothyroidism, unspecified: Secondary | ICD-10-CM

## 2017-05-18 DIAGNOSIS — E559 Vitamin D deficiency, unspecified: Secondary | ICD-10-CM | POA: Diagnosis not present

## 2017-05-18 DIAGNOSIS — R5383 Other fatigue: Secondary | ICD-10-CM | POA: Diagnosis not present

## 2017-05-18 NOTE — Progress Notes (Signed)
Date:  05/18/2017   Name:  Audrey Harrison   DOB:  1950-08-29   MRN:  [id number]  PCP:  Schuyler AmorPlonk, Ruslan Mccabe, MD    Chief Complaint: Diabetes (med refill- BS Range 95-100- Stopped Metformin in November and feels good and BS stays 95-100. )   History of Present Illness:  This is a 67 y.o. female seen for six month f/u. Stopped metformin XL given a1c 6.1% in Nov, home BGs well controlled, no longer drinking sodas. Synthroid dose decreased for low TSH, has been feeling more fatigued lately with less energy. Taking Prilosec prn about twice a week for GERD, hx esophagitis with stricture, no EGD x 5 yrs, never scheduled GI visit ordered last year. Still taking asa due for FH premature heart dz. Taking vit D supplement, level ok last visit. Occ BLE edema with prolonged standing, improves overnight.  Review of Systems:  Review of Systems  Constitutional: Negative for chills and fever.  Respiratory: Negative for cough and shortness of breath.   Cardiovascular: Negative for chest pain.  Endocrine: Negative for polydipsia and polyuria.  Genitourinary: Negative for difficulty urinating.  Neurological: Negative for syncope and light-headedness.    Patient Active Problem List   Diagnosis Date Noted  . Allergic rhinitis 06/03/2016  . Hyperlipidemia 08/02/2015  . Depression, major, recurrent (HCC) 11/30/2014  . GERD with esophagitis 11/30/2014  . Hypertension 10/30/2014  . Family history of breast cancer 06/08/2014  . Family history of premature coronary heart disease 06/08/2014  . Family history of macular degeneration 06/08/2014  . H/O alcohol abuse 06/08/2014  . History of colon polyps 06/08/2014  . Adult hypothyroidism 06/08/2014  . Obesity, Class I, BMI 30-34.9 06/08/2014  . Diabetes mellitus type 2, controlled, without complications (HCC) 06/08/2014  . Vitamin D deficiency 06/08/2014    Prior to Admission medications   Medication Sig Start Date End Date Taking? Authorizing Provider   cetirizine (ZYRTEC) 10 MG chewable tablet Chew 1 tablet by mouth daily as needed. Reported on 01/30/2015   Yes [provider]  Cholecalciferol (VITAMIN D3) 1000 units CAPS Take 1 capsule (1,000 Units total) by mouth daily. 06/03/16  Yes Melisha Eggleton, Chrissie NoaWilliam, MD  glucose blood (FREESTYLE LITE) test strip Use daily as directed 06/03/16  Yes Rocket Gunderson, Chrissie NoaWilliam, MD  levothyroxine (SYNTHROID, LEVOTHROID) 88 MCG tablet Take 1 tablet (88 mcg total) daily before breakfast by mouth. 12/14/16  Yes Staphanie Harbison, Chrissie NoaWilliam, MD  omeprazole (PRILOSEC) 40 MG capsule Take 1 capsule (40 mg total) by mouth daily as needed. 03/15/17  Yes Asah Lamay, Chrissie NoaWilliam, MD    Allergies  Allergen Reactions  . Zocor [Simvastatin] Other (See Comments)    Myalgias    Past Surgical History:  Procedure Laterality Date  . COLONOSCOPY  2012   cleared for 10 yrs- [place]  . VAGINAL HYSTERECTOMY      Social History   Tobacco Use  . Smoking status: Never Smoker  . Smokeless tobacco: Never Used  Substance Use Topics  . Alcohol use: No    Alcohol/week: 0.0 oz  . Drug use: No    Family History  Problem Relation Age of Onset  . Hypertension Mother   . Cancer Sister   . Hypertension Sister   . Cancer Brother   . Diabetes Paternal Grandmother   . Hypertension Paternal Grandmother     Medication list has been reviewed and updated.  Physical Examination: BP 122/80   Pulse 68   Resp 16   Ht 5\' 7"  (1.702 m)   Wt 226  lb 3.2 oz (102.6 kg)   SpO2 97%   BMI 35.43 kg/m   Physical Exam  Constitutional: She appears well-developed and well-nourished.  Cardiovascular: Normal rate, regular rhythm and normal heart sounds.  Pulmonary/Chest: Effort normal and breath sounds normal.  Musculoskeletal:  Trace BLE edema  Neurological: She is alert.  Skin: Skin is warm and dry.  Psychiatric: She has a normal mood and affect. Her behavior is normal.  Nursing note and vitals reviewed.   Assessment and Plan:  1. Controlled type 2 diabetes  mellitus without complication, without long-term current use of insulin (HCC) Off metformin XL, sees ophtho yearly - HgB A1c - Urine Microalbumin w/creat. ratio  2. GERD with esophagitis Hx stricture on Prilosec prn, no EGD > 5 yrs - Ambulatory referral to Gastroenterology  3. Adult hypothyroidism On decreased Synthroid dose, discussed possible alternating 100 mcg and 88 mcg doses - TSH  4. Allergic rhinitis, unspecified seasonality, unspecified trigger Well controlled on prn Zrytec  5. Vitamin D deficiency Well controlled on supplement  6. Fatigue, unspecified type May be due to decreased Synthroid dose or recurrence depression (on Effexor in past) - Comprehensive Metabolic Panel (CMET) - CBC  7. Obesity, Class I, BMI 30-34.9 Weight down 3#, exercise/weight loss discussed  8. Healthcare maintenance - HIV antibody (with reflex)  Return in about 6 months (around 11/17/2017).  [name] MD [facility]  05/18/2017

## 2017-05-19 ENCOUNTER — Other Ambulatory Visit: Payer: Self-pay | Admitting: Family Medicine

## 2017-05-19 DIAGNOSIS — E559 Vitamin D deficiency, unspecified: Secondary | ICD-10-CM

## 2017-05-19 DIAGNOSIS — R748 Abnormal levels of other serum enzymes: Secondary | ICD-10-CM | POA: Insufficient documentation

## 2017-05-19 LAB — COMPREHENSIVE METABOLIC PANEL
ALBUMIN: 4.4 g/dL (ref 3.6–4.8)
ALT: 48 IU/L — ABNORMAL HIGH (ref 0–32)
AST: 49 IU/L — ABNORMAL HIGH (ref 0–40)
Albumin/Globulin Ratio: 1.6 (ref 1.2–2.2)
Alkaline Phosphatase: 71 IU/L (ref 39–117)
BUN / CREAT RATIO: 10 — AB (ref 12–28)
BUN: 11 mg/dL (ref 8–27)
Bilirubin Total: 0.5 mg/dL (ref 0.0–1.2)
CALCIUM: 10.4 mg/dL — AB (ref 8.7–10.3)
CO2: 25 mmol/L (ref 20–29)
Chloride: 102 mmol/L (ref 96–106)
Creatinine, Ser: 1.06 mg/dL — ABNORMAL HIGH (ref 0.57–1.00)
GFR, EST AFRICAN AMERICAN: 64 mL/min/{1.73_m2} (ref >59)
GFR, EST NON AFRICAN AMERICAN: 55 mL/min/{1.73_m2} — AB (ref >59)
GLOBULIN, TOTAL: 2.8 g/dL (ref 1.5–4.5)
Glucose: 87 mg/dL (ref 65–99)
Potassium: 4.5 mmol/L (ref 3.5–5.2)
SODIUM: 140 mmol/L (ref 134–144)
TOTAL PROTEIN: 7.2 g/dL (ref 6.0–8.5)

## 2017-05-19 LAB — HEMOGLOBIN A1C
ESTIMATED AVERAGE GLUCOSE: 131 mg/dL
HEMOGLOBIN A1C: 6.2 % — AB (ref 4.8–5.6)

## 2017-05-19 LAB — TSH: TSH: 0.894 u[IU]/mL (ref 0.450–4.500)

## 2017-05-19 LAB — MICROALBUMIN / CREATININE URINE RATIO
Creatinine, Urine: 80.8 mg/dL
MICROALB/CREAT RATIO: 5.7 mg/g{creat} (ref 0.0–30.0)
Microalbumin, Urine: 4.6 ug/mL

## 2017-05-19 LAB — CBC
Hematocrit: 41.4 % (ref 34.0–46.6)
Hemoglobin: 13.6 g/dL (ref 11.1–15.9)
MCH: 30 pg (ref 26.6–33.0)
MCHC: 32.9 g/dL (ref 31.5–35.7)
MCV: 91 fL (ref 79–97)
PLATELETS: 284 10*3/uL (ref 150–379)
RBC: 4.53 x10E6/uL (ref 3.77–5.28)
RDW: 13.7 % (ref 12.3–15.4)
WBC: 8.5 10*3/uL (ref 3.4–10.8)

## 2017-05-19 LAB — HIV ANTIBODY (ROUTINE TESTING W REFLEX): HIV Screen 4th Generation wRfx: NONREACTIVE

## 2017-05-19 NOTE — Progress Notes (Signed)
.  comp

## 2017-05-20 ENCOUNTER — Encounter: Payer: Self-pay | Admitting: Gastroenterology

## 2017-05-25 ENCOUNTER — Other Ambulatory Visit: Payer: Self-pay | Admitting: Family Medicine

## 2017-05-25 DIAGNOSIS — Z8719 Personal history of other diseases of the digestive system: Secondary | ICD-10-CM

## 2017-05-27 ENCOUNTER — Other Ambulatory Visit: Payer: Self-pay | Admitting: Family Medicine

## 2017-05-27 DIAGNOSIS — E559 Vitamin D deficiency, unspecified: Secondary | ICD-10-CM | POA: Diagnosis not present

## 2017-05-27 DIAGNOSIS — R748 Abnormal levels of other serum enzymes: Secondary | ICD-10-CM | POA: Diagnosis not present

## 2017-05-27 DIAGNOSIS — R7989 Other specified abnormal findings of blood chemistry: Secondary | ICD-10-CM | POA: Diagnosis not present

## 2017-05-28 LAB — HEPATITIS PANEL, ACUTE
HEP A IGM: NEGATIVE
Hep B C IgM: NEGATIVE
Hep C Virus Ab: <0.1 s/co ratio (ref 0.0–0.9)
Hepatitis B Surface Ag: NEGATIVE

## 2017-05-28 LAB — COMPREHENSIVE METABOLIC PANEL
A/G RATIO: 1.9 (ref 1.2–2.2)
ALBUMIN: 4.3 g/dL (ref 3.6–4.8)
ALK PHOS: 75 IU/L (ref 39–117)
ALT: 42 IU/L — ABNORMAL HIGH (ref 0–32)
AST: 40 IU/L (ref 0–40)
BUN / CREAT RATIO: 15 (ref 12–28)
BUN: 15 mg/dL (ref 8–27)
Bilirubin Total: 0.4 mg/dL (ref 0.0–1.2)
CO2: 25 mmol/L (ref 20–29)
CREATININE: 1 mg/dL (ref 0.57–1.00)
Calcium: 9.9 mg/dL (ref 8.7–10.3)
Chloride: 102 mmol/L (ref 96–106)
GFR calc Af Amer: 68 mL/min/{1.73_m2} (ref >59)
GFR calc non Af Amer: 59 mL/min/{1.73_m2} — ABNORMAL LOW (ref >59)
GLOBULIN, TOTAL: 2.3 g/dL (ref 1.5–4.5)
Glucose: 119 mg/dL — ABNORMAL HIGH (ref 65–99)
Potassium: 5 mmol/L (ref 3.5–5.2)
SODIUM: 141 mmol/L (ref 134–144)
Total Protein: 6.6 g/dL (ref 6.0–8.5)

## 2017-05-28 LAB — VITAMIN D 25 HYDROXY (VIT D DEFICIENCY, FRACTURES): VIT D 25 HYDROXY: 34.7 ng/mL (ref 30.0–100.0)

## 2017-06-11 ENCOUNTER — Other Ambulatory Visit: Payer: Self-pay

## 2017-06-11 ENCOUNTER — Encounter: Payer: Self-pay | Admitting: Gastroenterology

## 2017-06-11 ENCOUNTER — Ambulatory Visit (INDEPENDENT_AMBULATORY_CARE_PROVIDER_SITE_OTHER): Payer: Medicare HMO | Admitting: Gastroenterology

## 2017-06-11 VITALS — BP 159/81 | HR 74 | Ht 67.0 in | Wt 227.6 lb

## 2017-06-11 DIAGNOSIS — K219 Gastro-esophageal reflux disease without esophagitis: Secondary | ICD-10-CM

## 2017-06-11 NOTE — Progress Notes (Signed)
Arlyss Repress, MD [address]  [address]  [place], [place] [address]  Main: [phone]  Fax: [phone]    Gastroenterology Consultation  Referring Provider:     Schuyler Amor, MD Primary Care Physician:  Schuyler Amor, MD Primary Gastroenterologist:  Dr. Arlyss Repress Reason for Consultation:     Chronic heartburn        HPI:   Audrey Harrison is a 67 y.o. female referred by Dr. Schuyler Amor, MD  for consultation & management of chronic heartburn. Patient reports that, several years ago approximately 2012 in [place], [place]. She underwent upper endoscopy due to difficulty swallowing to solids. 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 She reports that she has been suffering with heartburn for several years and has been intermittently on omeprazole 40 mg. Currently, she has intermittent symptoms of heartburn and she takes omeprazole 40 mg as needed. She has not tried other PPIs or H2 blockers. She reports that she has not been on omeprazole regularly. She denies recurrence of dysphagia since last EGD. She reports also had undergoing colonoscopy around that time and was reportedly normal. She denies nausea, vomiting, abdominal pain, weight loss She reports that she is working on her diet and finding difficulty to lose weight with history of hypothyroidism. Her hemoglobin A1c levels are improving and no longer on metformin.  She denies smoking or alcohol  NSAIDs: none  Antiplts/Anticoagulants/Anti thrombotics: none  GI Procedures: EGD and colonoscopy approximately in 2012 in [place] Records not available She denies family history of GI malignancy  Past Medical History:  Diagnosis Date  . Allergy   . Diabetes mellitus without complication (HCC)   . GERD (gastroesophageal reflux disease)   . Hypertension   . Thyroid disease     Past Surgical History:  Procedure Laterality Date  . COLONOSCOPY  2012     cleared for 10 yrs- [place] MD  . VAGINAL HYSTERECTOMY      Current Outpatient Medications:  .  cetirizine (ZYRTEC) 10 MG chewable tablet, Chew 1 tablet by mouth daily as needed. Reported on 01/30/2015, Disp: , Rfl:  .  Cholecalciferol (VITAMIN D3) 1000 units CAPS, Take 1 capsule (1,000 Units total) by mouth daily., Disp: 60 capsule, Rfl:  .  levothyroxine (SYNTHROID, LEVOTHROID) 88 MCG tablet, Take 1 tablet (88 mcg total) daily before breakfast by mouth., Disp: 90 tablet, Rfl: 3 .  omeprazole (PRILOSEC) 40 MG capsule, TAKE 1 CAPSULE BY MOUTH ONCE DAILY AS NEEDED, Disp: 30 capsule, Rfl: 3 .  glucose blood (FREESTYLE LITE) test strip, Use daily as directed (Patient not taking: Reported on 06/11/2017), Disp: 100 each, Rfl: 5   Family History  Problem Relation Age of Onset  . Hypertension Mother   . Cancer Sister   . Hypertension Sister   . Cancer Brother   . Diabetes Paternal Grandmother   . Hypertension Paternal Grandmother      Social History   Tobacco Use  . Smoking status: Never Smoker  . Smokeless tobacco: Never Used  Substance Use Topics  . Alcohol use: No    Alcohol/week: 0.0 oz  . Drug use: No    Allergies as of 06/11/2017 - Review Complete 06/11/2017  Allergen Reaction Noted  . Zocor [simvastatin] Other (See Comments) 08/02/2015    Review of Systems:    All systems reviewed and negative except where noted in HPI.   Physical Exam:  BP (!) 159/81  Pulse 74   Ht  (1.702 m)   Wt 227 lb 9.6 oz (103.2 kg)   BMI 35.65 kg/m  No LMP recorded. Patient has had a hysterectomy.  General:   Alert,  Well-developed, well-nourished, pleasant and cooperative in NAD Head:  Normocephalic and atraumatic. Eyes:  Sclera clear, no icterus.   Conjunctiva pink. Ears:  Normal auditory acuity. Nose:  No deformity, discharge, or lesions. Mouth:  No deformity or lesions,oropharynx pink & moist. Neck:  Supple; no masses or thyromegaly. Lungs:  Respirations even and unlabored.   Clear throughout to auscultation.   No wheezes, crackles, or rhonchi. No acute distress. Heart:  Regular rate and rhythm; no murmurs, clicks, rubs, or gallops. Abdomen:  Normal bowel sounds. Soft, non-tender and non-distended without masses, hepatosplenomegaly or hernias noted.  No guarding or rebound tenderness.   Rectal: Not performed Msk:  Symmetrical without gross deformities. Good, equal movement & strength bilaterally. Pulses:  Normal pulses noted. Extremities:  No clubbing or edema.  No cyanosis. Neurologic:  Alert and oriented x3;  grossly normal neurologically. Skin:  Intact without significant lesions or rashes. No jaundice. Psych:  Alert and cooperative. Normal mood and affect.  Imaging Studies: No abdominal imaging  Assessment and Plan:   Audrey Harrison is a 67 y.o. female with history of diabetes, chronic GERD, status was dilation for dysphagia in 2012 has been intermittently on proton pump inhibitor for intermittent heartburn  chronic GERD with possible peptic stricture or Schatzki's ring - Recommend EGD to evaluate for Barrett's given chronicity of symptoms - can continue PPI for now   Follow up based on the EGD results   Arlyss Repress, MD

## 2017-06-14 ENCOUNTER — Ambulatory Visit
Admission: RE | Admit: 2017-06-14 | Discharge: 2017-06-14 | Disposition: A | Discharge status: Home / Self Care | Payer: Medicare HMO | Source: Ambulatory Visit | Attending: Gastroenterology | Admitting: Gastroenterology

## 2017-06-14 ENCOUNTER — Ambulatory Visit: Payer: Medicare HMO | Admitting: Anesthesiology

## 2017-06-14 ENCOUNTER — Encounter: Payer: Self-pay | Admitting: Anesthesiology

## 2017-06-14 ENCOUNTER — Encounter
Admission: RE | Disposition: A | Discharge status: Home / Self Care | Payer: Self-pay | Source: Ambulatory Visit | Attending: Gastroenterology

## 2017-06-14 DIAGNOSIS — I10 Essential (primary) hypertension: Secondary | ICD-10-CM | POA: Insufficient documentation

## 2017-06-14 DIAGNOSIS — E119 Type 2 diabetes mellitus without complications: Secondary | ICD-10-CM | POA: Insufficient documentation

## 2017-06-14 DIAGNOSIS — Z79899 Other long term (current) drug therapy: Secondary | ICD-10-CM | POA: Diagnosis not present

## 2017-06-14 DIAGNOSIS — K449 Diaphragmatic hernia without obstruction or gangrene: Secondary | ICD-10-CM | POA: Insufficient documentation

## 2017-06-14 DIAGNOSIS — R69 Illness, unspecified: Secondary | ICD-10-CM | POA: Diagnosis not present

## 2017-06-14 DIAGNOSIS — K219 Gastro-esophageal reflux disease without esophagitis: Secondary | ICD-10-CM | POA: Insufficient documentation

## 2017-06-14 DIAGNOSIS — E039 Hypothyroidism, unspecified: Secondary | ICD-10-CM | POA: Diagnosis not present

## 2017-06-14 HISTORY — PX: ESOPHAGOGASTRODUODENOSCOPY (EGD) WITH PROPOFOL: SHX5813

## 2017-06-14 SURGERY — ESOPHAGOGASTRODUODENOSCOPY (EGD) WITH PROPOFOL
Anesthesia: General

## 2017-06-14 MED ORDER — LIDOCAINE HCL (PF) 2 % IJ SOLN
INTRAMUSCULAR | Status: AC
  Filled 2017-06-14: qty 10

## 2017-06-14 MED ORDER — PROPOFOL 10 MG/ML IV BOLUS
INTRAVENOUS | Status: DC | PRN
  Administered 2017-06-14: 70 mg via INTRAVENOUS

## 2017-06-14 MED ORDER — SODIUM CHLORIDE 0.9 % IV SOLN
INTRAVENOUS | Status: DC
  Administered 2017-06-14: 1000 mL via INTRAVENOUS

## 2017-06-14 MED ORDER — LIDOCAINE HCL (CARDIAC) PF 100 MG/5ML IV SOSY
PREFILLED_SYRINGE | INTRAVENOUS | Status: DC | PRN
  Administered 2017-06-14: 100 mg via INTRAVENOUS

## 2017-06-14 MED ORDER — PROPOFOL 500 MG/50ML IV EMUL
INTRAVENOUS | Status: AC
  Filled 2017-06-14: qty 50

## 2017-06-14 MED ORDER — PROPOFOL 500 MG/50ML IV EMUL
INTRAVENOUS | Status: DC | PRN
  Administered 2017-06-14: 200 ug/kg/min via INTRAVENOUS

## 2017-06-14 NOTE — H&P (Signed)
Audrey Repress, MD [address]  [address]  [place], [place] [address]  Main: [phone]  Fax: [phone] Pager: [phone]  Primary Care Physician:  Reubin Milan, MD Primary Gastroenterologist:  Dr. Arlyss Harrison  Pre-Procedure History & Physical: HPI:  Audrey Harrison is a 67 y.o. female is here for an endoscopy.   Past Medical History:  Diagnosis Date  . Allergy   . Diabetes mellitus without complication (HCC)   . GERD (gastroesophageal reflux disease)   . Hypertension   . Thyroid disease     Past Surgical History:  Procedure Laterality Date  . COLONOSCOPY  2012   cleared for 10 yrs- [place] MD  . VAGINAL HYSTERECTOMY      Prior to Admission medications   Medication Sig Start Date End Date Taking? Authorizing Provider  cetirizine (ZYRTEC) 10 MG chewable tablet Chew 1 tablet by mouth daily as needed. Reported on 01/30/2015    [provider]  Cholecalciferol (VITAMIN D3) 1000 units CAPS Take 1 capsule (1,000 Units total) by mouth daily. 06/03/16   Plonk, Chrissie Noa, MD  glucose blood (FREESTYLE LITE) test strip Use daily as directed Patient not taking: Reported on 06/11/2017 06/03/16   Schuyler Amor, MD  levothyroxine (SYNTHROID, LEVOTHROID) 88 MCG tablet Take 1 tablet (88 mcg total) daily before breakfast by mouth. 12/14/16   Plonk, Chrissie Noa, MD  omeprazole (PRILOSEC) 40 MG capsule TAKE 1 CAPSULE BY MOUTH ONCE DAILY AS NEEDED 05/25/17   Schuyler Amor, MD    Allergies as of 06/11/2017 - Review Complete 06/11/2017  Allergen Reaction Noted  . Zocor [simvastatin] Other (See Comments) 08/02/2015    Family History  Problem Relation Age of Onset  . Hypertension Mother   . Cancer Sister   . Hypertension Sister   . Cancer Brother   . Diabetes Paternal Grandmother   . Hypertension Paternal Grandmother     Social History   Socioeconomic History  . Marital status: Divorced    Spouse name: Not on file  . Number of children: Not on file  . Years  of education: Not on file  . Highest education level: Not on file  Occupational History  . Occupation: Teacher, music  . Financial resource strain: Patient refused  . Food insecurity:    Worry: Patient refused    Inability: Patient refused  . Transportation needs:    Medical: Not on file    Non-medical: Not on file  Tobacco Use  . Smoking status: Never Smoker  . Smokeless tobacco: Never Used  Substance and Sexual Activity  . Alcohol use: No    Alcohol/week: 0.0 oz  . Drug use: No  . Sexual activity: Not Currently  Lifestyle  . Physical activity:    Days per week: Patient refused    Minutes per session: Patient refused  . Stress: Not on file  Relationships  . Social connections:    Talks on phone: Patient refused    Gets together: Patient refused    Attends religious service: Patient refused    Active member of club or organization: Patient refused    Attends meetings of clubs or organizations: Patient refused    Relationship status: Patient refused  . Intimate partner violence:    Fear of current or ex partner: Patient refused    Emotionally abused: Patient refused    Physically abused: Patient refused    Forced sexual activity: Patient refused  Other Topics Concern  . Not on file  Social History Narrative  .  Not on file    Review of Systems: See HPI, otherwise negative ROS  Physical Exam: BP (!) 159/86   Pulse 69   Temp (!) 96.8 F (36 C) (Tympanic)   Resp 20   Ht  (1.702 m)   Wt 225 lb (102.1 kg)   SpO2 98%   BMI 35.24 kg/m  General:   Alert,  pleasant and cooperative in NAD Head:  Normocephalic and atraumatic. Neck:  Supple; no masses or thyromegaly. Lungs:  Clear throughout to auscultation.    Heart:  Regular rate and rhythm. Abdomen:  Soft, nontender and nondistended. Normal bowel sounds, without guarding, and without rebound.   Neurologic:  Alert and  oriented x4;  grossly normal neurologically.  Impression/Plan: [name] is here for an endoscopy to be performed for chronic GERD  Risks, benefits, limitations, and alternatives regarding  endoscopy have been reviewed with the patient.  Questions have been answered.  All parties agreeable.   Lannette Donath, MD  06/14/2017, 11:28 AM

## 2017-06-14 NOTE — Anesthesia Post-op Follow-up Note (Signed)
Anesthesia QCDR form completed.        

## 2017-06-14 NOTE — Op Note (Signed)
[facility] Gastroenterology Patient Name: Audrey Harrison Procedure Date: 06/14/2017 11:30 AM MRN: [id number] Account #: [id number] Date of Birth: September 09, 1950 Admit Type: Outpatient Age: 67 Room: [facility] ENDO ROOM 2 Gender: Female Note Status: Finalized Procedure:            Upper GI endoscopy Indications:          Heartburn, Follow-up of gastro-esophageal reflux disease Providers:            Lin Landsman MD, MD Referring MD:         Halina Maidens, MD (Referring MD) Medicines:            Monitored Anesthesia Care Complications:        No immediate complications. Estimated blood loss: None. Procedure:            Pre-Anesthesia Assessment:                       - Prior to the procedure, a History and Physical was                        performed, and patient medications and allergies were                        reviewed. The patient is competent. The risks and                        benefits of the procedure and the sedation options and                        risks were discussed with the patient. All questions                        were answered and informed consent was obtained.                        Patient identification and proposed procedure were                        verified by the physician, the nurse, the                        anesthesiologist, the anesthetist and the technician in                        the pre-procedure area in the procedure room in the                        endoscopy suite. Mental Status Examination: alert and                        oriented. Airway Examination: normal oropharyngeal                        airway and neck mobility. Respiratory Examination:                        clear to auscultation. CV Examination: normal.                        Prophylactic Antibiotics: The patient does not require  prophylactic antibiotics. Prior Anticoagulants: The                        patient has taken no previous  anticoagulant or                        antiplatelet agents. ASA Grade Assessment: III - A                        patient with severe systemic disease. After reviewing                        the risks and benefits, the patient was deemed in                        satisfactory condition to undergo the procedure. The                        anesthesia plan was to use monitored anesthesia care                        (MAC). Immediately prior to administration of                        medications, the patient was re-assessed for adequacy                        to receive sedatives. The heart rate, respiratory rate,                        oxygen saturations, blood pressure, adequacy of                        pulmonary ventilation, and response to care were                        monitored throughout the procedure. The physical status                        of the patient was re-assessed after the procedure.                       After obtaining informed consent, the endoscope was                        passed under direct vision. Throughout the procedure,                        the patient's blood pressure, pulse, and oxygen                        saturations were monitored continuously. The Endoscope                        was introduced through the mouth, and advanced to the                        second part of duodenum. The upper GI endoscopy was  accomplished without difficulty. The patient tolerated                        the procedure well. Findings:      The duodenal bulb and second portion of the duodenum were normal.      The entire examined stomach was normal.      The cardia and gastric fundus were normal on retroflexion.      The gastroesophageal junction and examined esophagus were normal.      A 1 cm hiatal hernia was present. Impression:           - Normal duodenal bulb and second portion of the                        duodenum.                       -  Normal stomach.                       - Normal gastroesophageal junction and esophagus.                       - 1 cm hiatal hernia.                       - No specimens collected. Recommendation:       - Discharge patient to home.                       - Resume previous diet.                       - Continue present medications. Procedure Code(s):    --- Professional ---                       [phone], Esophagogastroduodenoscopy, flexible, transoral;                        diagnostic, including collection of specimen(s) by                        brushing or washing, when performed (separate procedure) Diagnosis Code(s):    --- Professional ---                       K44.9, Diaphragmatic hernia without obstruction or                        gangrene                       R12, Heartburn                       K21.9, Gastro-esophageal reflux disease without                        esophagitis CPT copyright 2017 American Medical Association. All rights reserved. The codes documented in this report are preliminary and upon coder review may  be revised to meet current compliance requirements. Dr. Ulyess Mort Lin Landsman MD, MD 06/14/2017 11:42:41 AM This report has been signed electronically. Number of Addenda: 0 Note Initiated On: 06/14/2017 11:30 AM  [facility]

## 2017-06-14 NOTE — Transfer of Care (Signed)
Immediate Anesthesia Transfer of Care Note  Patient: Audrey Harrison  Procedure(s) Performed: ESOPHAGOGASTRODUODENOSCOPY (EGD) WITH PROPOFOL (N/A )  Patient Location: PACU and Endoscopy Unit  Anesthesia Type:General  Level of Consciousness: awake  Airway & Oxygen Therapy: Patient Spontanous Breathing  Post-op Assessment: Report given to RN  Post vital signs: stable  Last Vitals:  Vitals Value Taken Time  BP 94/57 06/14/2017 11:47 AM  Temp 36 C 06/14/2017 11:40 AM  Pulse 71 06/14/2017 11:48 AM  Resp 17 06/14/2017 11:48 AM  SpO2 96 % 06/14/2017 11:48 AM  Vitals shown include unvalidated device data.  Last Pain:  Vitals:   06/14/17 1140  TempSrc: Tympanic  PainSc:          Complications: No apparent anesthesia complications

## 2017-06-14 NOTE — Anesthesia Preprocedure Evaluation (Signed)
Anesthesia Evaluation  Patient identified by MRN, date of birth, ID band Patient awake    Reviewed: Allergy & Precautions, H&P , NPO status , Patient's Chart, lab work & pertinent test results  History of Anesthesia Complications Negative for: history of anesthetic complications  Airway Mallampati: III  TM Distance: <3 FB Neck ROM: limited    Dental  (+) Chipped, Poor Dentition   Pulmonary neg pulmonary ROS, neg shortness of breath,  Signs and symptoms suggestive of sleep apnea            Cardiovascular Exercise Tolerance: Good hypertension, (-) angina(-) CAD and (-) DOE      Neuro/Psych PSYCHIATRIC DISORDERS Depression negative neurological ROS  negative psych ROS   GI/Hepatic Neg liver ROS, GERD  Medicated and Controlled,  Endo/Other  diabetes, Type 2Hypothyroidism   Renal/GU negative Renal ROS  negative genitourinary   Musculoskeletal   Abdominal   Peds  Hematology negative hematology ROS (+)   Anesthesia Other Findings Past Medical History: No date: Allergy No date: Diabetes mellitus without complication (HCC) No date: GERD (gastroesophageal reflux disease) No date: Hypertension No date: Thyroid disease  Past Surgical History: 2012: COLONOSCOPY     Comment:  cleared for 10 yrs- [place] MD No date: VAGINAL HYSTERECTOMY  BMI    Body Mass Index:  35.24 kg/m      Reproductive/Obstetrics negative OB ROS                             Anesthesia Physical Anesthesia Plan  ASA: III  Anesthesia Plan: General   Post-op Pain Management:    Induction: Intravenous  PONV Risk Score and Plan: Propofol infusion and TIVA  Airway Management Planned: Natural Airway and Nasal Cannula  Additional Equipment:   Intra-op Plan:   Post-operative Plan:   Informed Consent: I have reviewed the patients History and Physical, chart, labs and discussed the procedure including the risks,  benefits and alternatives for the proposed anesthesia with the patient or authorized representative who has indicated his/her understanding and acceptance.   Dental Advisory Given  Plan Discussed with: Anesthesiologist, CRNA and Surgeon  Anesthesia Plan Comments: (Patient consented for risks of anesthesia including but not limited to:  - adverse reactions to medications - risk of intubation if required - damage to teeth, lips or other oral mucosa - sore throat or hoarseness - Damage to heart, brain, lungs or loss of life  Patient voiced understanding.)        Anesthesia Quick Evaluation

## 2017-06-15 ENCOUNTER — Encounter: Payer: Self-pay | Admitting: Gastroenterology

## 2017-06-15 NOTE — Anesthesia Postprocedure Evaluation (Signed)
Anesthesia Post Note  Patient: Audrey Harrison  Procedure(s) Performed: ESOPHAGOGASTRODUODENOSCOPY (EGD) WITH PROPOFOL (N/A )  Patient location during evaluation: Endoscopy Anesthesia Type: General Level of consciousness: awake and alert Pain management: pain level controlled Vital Signs Assessment: post-procedure vital signs reviewed and stable Respiratory status: spontaneous breathing, nonlabored ventilation, respiratory function stable and patient connected to nasal cannula oxygen Cardiovascular status: blood pressure returned to baseline and stable Postop Assessment: no apparent nausea or vomiting Anesthetic complications: no     Last Vitals:  Vitals:   06/14/17 1140 06/14/17 1150  BP: (!) 94/57 (!) 94/57  Pulse: 73 71  Resp: 20 19  Temp: (!) 36 C   SpO2: 96% 96%    Last Pain:  Vitals:   06/14/17 1140  TempSrc: Tympanic  PainSc:                  Cleda Mccreedy Piscitello

## 2017-08-28 DIAGNOSIS — E039 Hypothyroidism, unspecified: Secondary | ICD-10-CM | POA: Diagnosis not present

## 2017-08-28 DIAGNOSIS — E669 Obesity, unspecified: Secondary | ICD-10-CM | POA: Diagnosis not present

## 2017-08-28 DIAGNOSIS — Z6834 Body mass index (BMI) 34.0-34.9, adult: Secondary | ICD-10-CM | POA: Diagnosis not present

## 2017-08-28 DIAGNOSIS — K219 Gastro-esophageal reflux disease without esophagitis: Secondary | ICD-10-CM | POA: Diagnosis not present

## 2017-08-28 DIAGNOSIS — R030 Elevated blood-pressure reading, without diagnosis of hypertension: Secondary | ICD-10-CM | POA: Diagnosis not present

## 2017-08-28 DIAGNOSIS — J309 Allergic rhinitis, unspecified: Secondary | ICD-10-CM | POA: Diagnosis not present

## 2017-08-28 DIAGNOSIS — Z7722 Contact with and (suspected) exposure to environmental tobacco smoke (acute) (chronic): Secondary | ICD-10-CM | POA: Diagnosis not present

## 2017-08-28 DIAGNOSIS — Z7982 Long term (current) use of aspirin: Secondary | ICD-10-CM | POA: Diagnosis not present

## 2017-08-28 DIAGNOSIS — Z803 Family history of malignant neoplasm of breast: Secondary | ICD-10-CM | POA: Diagnosis not present

## 2017-08-28 DIAGNOSIS — Z809 Family history of malignant neoplasm, unspecified: Secondary | ICD-10-CM | POA: Diagnosis not present

## 2017-11-17 ENCOUNTER — Ambulatory Visit: Payer: Self-pay | Admitting: Internal Medicine

## 2017-11-29 ENCOUNTER — Ambulatory Visit (INDEPENDENT_AMBULATORY_CARE_PROVIDER_SITE_OTHER): Payer: Medicare HMO | Admitting: Internal Medicine

## 2017-11-29 ENCOUNTER — Encounter: Payer: Self-pay | Admitting: Internal Medicine

## 2017-11-29 ENCOUNTER — Ambulatory Visit
Admission: RE | Admit: 2017-11-29 | Discharge: 2017-11-29 | Disposition: A | Discharge status: Home / Self Care | Payer: Medicare HMO | Source: Ambulatory Visit | Attending: Internal Medicine | Admitting: Internal Medicine

## 2017-11-29 VITALS — BP 128/68 | HR 70 | Ht 67.0 in | Wt 224.0 lb

## 2017-11-29 DIAGNOSIS — M25562 Pain in left knee: Secondary | ICD-10-CM | POA: Insufficient documentation

## 2017-11-29 DIAGNOSIS — Z23 Encounter for immunization: Secondary | ICD-10-CM

## 2017-11-29 DIAGNOSIS — M1712 Unilateral primary osteoarthritis, left knee: Secondary | ICD-10-CM | POA: Insufficient documentation

## 2017-11-29 MED ORDER — PREDNISONE 10 MG PO TABS: 10.0000 mg | ORAL_TABLET | ORAL | 0 refills | Status: AC

## 2017-11-29 NOTE — Progress Notes (Signed)
Date:  11/29/2017   Name:  Audrey Harrison   DOB:  06-26-1950   MRN:  [id number]   Chief Complaint: Knee Pain (Started 2 weeks ago. Behind the knee the pain is constant. Pain radiates in the front of knee down into the ankle causing ankle to ach. ) and Immunizations (High dose flu shot.)  Knee Pain   There was no injury mechanism. The pain is present in the left knee. The quality of the pain is described as aching. The pain is moderate. The pain has been constant since onset. Associated symptoms include an inability to bear weight. Pertinent negatives include no numbness or tingling. She reports no foreign bodies present. The symptoms are aggravated by weight bearing (and lying down). She has tried acetaminophen and NSAIDs for the symptoms. The treatment provided no relief.    Review of Systems  Constitutional: Negative for chills, fatigue and fever.  Respiratory: Negative for chest tightness and shortness of breath.   Cardiovascular: Negative for chest pain and palpitations.  Musculoskeletal: Positive for arthralgias. Negative for back pain, gait problem, joint swelling and myalgias.  Neurological: Negative for dizziness, tingling and numbness.  Hematological: Negative for adenopathy.    Patient Active Problem List   Diagnosis Date Noted  . Gastroesophageal reflux disease without esophagitis   . Elevated liver enzymes 05/19/2017  . Allergic rhinitis 06/03/2016  . Hyperlipidemia 08/02/2015  . Depression, major, recurrent (HCC) 11/30/2014  . GERD with esophagitis 11/30/2014  . Hypertension 10/30/2014  . Family history of breast cancer 06/08/2014  . Family history of premature coronary heart disease 06/08/2014  . Family history of macular degeneration 06/08/2014  . H/O alcohol abuse 06/08/2014  . History of colon polyps 06/08/2014  . Adult hypothyroidism 06/08/2014  . Obesity, Class I, BMI 30-34.9 06/08/2014  . Diabetes mellitus type 2, controlled, without complications  (HCC) 06/08/2014  . Vitamin D deficiency 06/08/2014    Allergies  Allergen Reactions  . Zocor [Simvastatin] Other (See Comments)    Myalgias    Past Surgical History:  Procedure Laterality Date  . COLONOSCOPY  2012   cleared for 10 yrs- [place] MD  . ESOPHAGOGASTRODUODENOSCOPY (EGD) WITH PROPOFOL N/A 06/14/2017   Procedure: ESOPHAGOGASTRODUODENOSCOPY (EGD) WITH PROPOFOL;  Surgeon: Toney Reil, MD;  Location: [facility] ENDOSCOPY;  Service: Gastroenterology;  Laterality: N/A;  . VAGINAL HYSTERECTOMY      Social History   Tobacco Use  . Smoking status: Never Smoker  . Smokeless tobacco: Never Used  Substance Use Topics  . Alcohol use: No    Alcohol/week: 0.0 standard drinks  . Drug use: No     Medication list has been reviewed and updated.  Current Meds  Medication Sig  . cetirizine (ZYRTEC) 10 MG chewable tablet Chew 1 tablet by mouth daily as needed. Reported on 01/30/2015  . Cholecalciferol (VITAMIN D3) 1000 units CAPS Take 1 capsule (1,000 Units total) by mouth daily.  Marland Kitchen glucose blood (FREESTYLE LITE) test strip Use daily as directed  . levothyroxine (SYNTHROID, LEVOTHROID) 88 MCG tablet Take 1 tablet (88 mcg total) daily before breakfast by mouth.  Marland Kitchen omeprazole (PRILOSEC) 40 MG capsule TAKE 1 CAPSULE BY MOUTH ONCE DAILY AS NEEDED    PHQ 2/9 Scores 11/29/2017 05/18/2017 08/02/2015 02/15/2015  PHQ - 2 Score 0 0 2 2  PHQ- 9 Score 0 - 7 7    Physical Exam  Constitutional: She is oriented to person, place, and time. She appears well-developed. No distress.  HENT:  Head: Normocephalic and  atraumatic.  Cardiovascular: Normal rate, regular rhythm and normal heart sounds.  Pulmonary/Chest: Effort normal and breath sounds normal. No respiratory distress.  Musculoskeletal:       Right hip: She exhibits decreased range of motion.       Left hip: Normal.       Right knee: Normal.       Left knee: She exhibits normal range of motion, no swelling and no effusion. Tenderness  found.  Neurological: She is alert and oriented to person, place, and time. She has normal strength. No sensory deficit. Gait normal.  Skin: Skin is warm and dry. No rash noted.  Psychiatric: She has a normal mood and affect. Her behavior is normal. Thought content normal.  Nursing note and vitals reviewed.   BP 128/68 (BP Location: Right Arm, Patient Position: Sitting, Cuff Size: Large)   Pulse 70   Ht 5\' 7"  (1.702 m)   Wt 224 lb (101.6 kg)   SpO2 98%   BMI 35.08 kg/m   Assessment and Plan: 1. Acute pain of left knee May need Ortho evaluation Hold nsaids and try prednisone taper - DG Knee Complete 4 Views Left; Future - predniSONE (DELTASONE) 10 MG tablet; Take 1 tablet (10 mg total) by mouth as directed for 6 days. Take 6,5,4,3,2,1 then stop  Dispense: 21 tablet; Refill: 0  2. Encounter for immunization - Flu vaccine HIGH DOSE PF   Partially dictated using Animal nutritionist. Any errors are unintentional.  Bari Edward, MD [facility]  11/29/2017

## 2017-12-14 ENCOUNTER — Ambulatory Visit (INDEPENDENT_AMBULATORY_CARE_PROVIDER_SITE_OTHER): Payer: Medicare HMO | Admitting: Internal Medicine

## 2017-12-14 ENCOUNTER — Encounter: Payer: Self-pay | Admitting: Internal Medicine

## 2017-12-14 VITALS — BP 130/78 | HR 65 | Ht 67.0 in | Wt 224.0 lb

## 2017-12-14 DIAGNOSIS — E119 Type 2 diabetes mellitus without complications: Secondary | ICD-10-CM

## 2017-12-14 DIAGNOSIS — K2100 Gastro-esophageal reflux disease with esophagitis, without bleeding: Secondary | ICD-10-CM

## 2017-12-14 DIAGNOSIS — E782 Mixed hyperlipidemia: Secondary | ICD-10-CM | POA: Diagnosis not present

## 2017-12-14 DIAGNOSIS — R748 Abnormal levels of other serum enzymes: Secondary | ICD-10-CM

## 2017-12-14 DIAGNOSIS — E039 Hypothyroidism, unspecified: Secondary | ICD-10-CM | POA: Diagnosis not present

## 2017-12-14 DIAGNOSIS — Z1231 Encounter for screening mammogram for malignant neoplasm of breast: Secondary | ICD-10-CM | POA: Diagnosis not present

## 2017-12-14 MED ORDER — OMEPRAZOLE 40 MG PO CPDR: DELAYED_RELEASE_CAPSULE | ORAL | 3 refills | Status: AC

## 2017-12-14 NOTE — Progress Notes (Signed)
Date:  12/14/2017   Name:  Audrey Harrison   DOB:  1950-08-16   MRN:  [id number]   Chief Complaint: Diabetes (Follow up. Foot Exam.); Hypothyroidism; and Gastroesophageal Reflux  Diabetes  She presents for her follow-up diabetic visit. Diabetes type: probably always pre-diabetes. Her disease course has been stable. Pertinent negatives for hypoglycemia include no headaches or tremors. Pertinent negatives for diabetes include no blurred vision, no chest pain, no fatigue, no foot paresthesias, no polydipsia, no polyuria, no weakness and no weight loss. Current diabetic treatment includes diet. She is compliant with treatment most of the time. Her weight is stable. She monitors blood glucose at home 1-2 x per week. There is no change in her home blood glucose trend. Her breakfast blood glucose is taken between 6-7 am. Her breakfast blood glucose range is generally 90-110 mg/dl. An ACE inhibitor/angiotensin II receptor blocker is not being taken. Eye exam is not current.  Thyroid Problem  Presents for follow-up visit. Patient reports no fatigue, palpitations, tremors or weight loss. The symptoms have been stable. Her past medical history is significant for hyperlipidemia.  Hyperlipidemia  This is a chronic problem. Pertinent negatives include no chest pain or shortness of breath. She is currently on no antihyperlipidemic treatment.  Gastroesophageal Reflux  She complains of heartburn. She reports no abdominal pain, no chest pain or no coughing. This is a recurrent problem. The problem occurs rarely. Pertinent negatives include no fatigue or weight loss. She has tried a PPI for the symptoms. The treatment provided significant relief.   Lab Results  Component Value Date   HGBA1C 6.2 (H) 05/18/2017   Lab Results  Component Value Date   TSH 0.894 05/18/2017   Lab Results  Component Value Date   CREATININE 1.00 05/27/2017   BUN 15 05/27/2017   NA 141 05/27/2017   K 5.0 05/27/2017   CL 102  05/27/2017   CO2 25 05/27/2017   Lab Results  Component Value Date   CHOL 191 10/30/2014   HDL 55 10/30/2014   LDLCALC 108 (H) 10/30/2014   TRIG 142 10/30/2014   CHOLHDL 3.5 10/30/2014     Review of Systems  Constitutional: Negative for appetite change, fatigue, fever, unexpected weight change and weight loss.  HENT: Negative for tinnitus and trouble swallowing.   Eyes: Negative for blurred vision and visual disturbance.  Respiratory: Negative for cough, chest tightness and shortness of breath.   Cardiovascular: Negative for chest pain, palpitations and leg swelling.  Gastrointestinal: Positive for heartburn. Negative for abdominal pain.       Reflux if she eats spicy foods  Endocrine: Negative for polydipsia and polyuria.  Genitourinary: Negative for dysuria and hematuria.  Musculoskeletal: Positive for arthralgias (knee pain improving).  Neurological: Negative for tremors, weakness, numbness and headaches.  Psychiatric/Behavioral: Negative for dysphoric mood.    Patient Active Problem List   Diagnosis Date Noted  . Elevated liver enzymes 05/19/2017  . Allergic rhinitis 06/03/2016  . Hyperlipidemia 08/02/2015  . Depression, major, recurrent (HCC) 11/30/2014  . GERD with esophagitis 11/30/2014  . Hypertension 10/30/2014  . Family history of premature coronary heart disease 06/08/2014  . H/O alcohol abuse 06/08/2014  . History of colon polyps 06/08/2014  . Adult hypothyroidism 06/08/2014  . Obesity, Class I, BMI 30-34.9 06/08/2014  . Diabetes mellitus type 2, controlled, without complications (HCC) 06/08/2014  . Vitamin D deficiency 06/08/2014    Allergies  Allergen Reactions  . Zocor [Simvastatin] Other (See Comments)    Myalgias  Past Surgical History:  Procedure Laterality Date  . COLONOSCOPY  2012   cleared for 10 yrs- [place] MD  . ESOPHAGOGASTRODUODENOSCOPY (EGD) WITH PROPOFOL N/A 06/14/2017   Procedure: ESOPHAGOGASTRODUODENOSCOPY (EGD) WITH PROPOFOL;   Surgeon: Toney Reil, MD;  Location: [facility] ENDOSCOPY;  Service: Gastroenterology;  Laterality: N/A;  . VAGINAL HYSTERECTOMY      Social History   Tobacco Use  . Smoking status: Never Smoker  . Smokeless tobacco: Never Used  Substance Use Topics  . Alcohol use: No    Alcohol/week: 0.0 standard drinks  . Drug use: No     Medication list has been reviewed and updated.  Current Meds  Medication Sig  . cetirizine (ZYRTEC) 10 MG chewable tablet Chew 1 tablet by mouth daily as needed. Reported on 01/30/2015  . Cholecalciferol (VITAMIN D3) 1000 units CAPS Take 1 capsule (1,000 Units total) by mouth daily.  Marland Kitchen glucose blood (FREESTYLE LITE) test strip Use daily as directed  . levothyroxine (SYNTHROID, LEVOTHROID) 88 MCG tablet Take 1 tablet (88 mcg total) daily before breakfast by mouth.  Marland Kitchen omeprazole (PRILOSEC) 40 MG capsule TAKE 1 CAPSULE BY MOUTH ONCE DAILY AS NEEDED    PHQ 2/9 Scores 11/29/2017 05/18/2017 08/02/2015 02/15/2015  PHQ - 2 Score 0 0 2 2  PHQ- 9 Score 0 - 7 7    Physical Exam  Constitutional: She is oriented to person, place, and time. She appears well-developed. No distress.  HENT:  Head: Normocephalic and atraumatic.  Neck: Normal range of motion. Neck supple. Carotid bruit is not present.  Cardiovascular: Normal rate, regular rhythm and normal heart sounds.  Pulmonary/Chest: Effort normal and breath sounds normal. No respiratory distress.  Musculoskeletal: Normal range of motion.  Neurological: She is alert and oriented to person, place, and time. She has normal strength and normal reflexes. No sensory deficit.  Skin: Skin is warm and dry. No rash noted.  Psychiatric: She has a normal mood and affect. Her behavior is normal. Thought content normal.  Nursing note and vitals reviewed.   BP 130/78 (BP Location: Right Arm, Patient Position: Sitting, Cuff Size: Large)   Pulse 65   Ht 5\' 7"  (1.702 m)   Wt 224 lb (101.6 kg)   SpO2 98%   BMI 35.08 kg/m    Assessment and Plan: 1. Controlled type 2 diabetes mellitus without complication, without long-term current use of insulin (HCC) Likely only prediabetes since A1C never > 6.5 - Comprehensive metabolic panel - Hemoglobin A1c  2. GERD with esophagitis Continue PPI as needed - omeprazole (PRILOSEC) 40 MG capsule; TAKE 1 CAPSULE BY MOUTH ONCE DAILY AS NEEDED  Dispense: 90 capsule; Refill: 3  3. Mixed hyperlipidemia Not on statin therapy but will check and advise - Lipid panel  4. Adult hypothyroidism Supplemented -  - TSH  5. Encounter for screening mammogram for breast cancer Overdue but having no problems. - MM 3D SCREEN BREAST BILATERAL; Future   Partially dictated using Animal nutritionist. Any errors are unintentional.  Bari Edward, MD [facility]  12/14/2017

## 2017-12-15 ENCOUNTER — Encounter: Payer: Self-pay | Admitting: Internal Medicine

## 2017-12-15 LAB — COMPREHENSIVE METABOLIC PANEL
ALBUMIN: 4.5 g/dL (ref 3.6–4.8)
ALK PHOS: 66 IU/L (ref 39–117)
ALT: 34 IU/L — AB (ref 0–32)
AST: 34 IU/L (ref 0–40)
Albumin/Globulin Ratio: 1.9 (ref 1.2–2.2)
BUN/Creatinine Ratio: 12 (ref 12–28)
BUN: 12 mg/dL (ref 8–27)
Bilirubin Total: 0.5 mg/dL (ref 0.0–1.2)
CO2: 22 mmol/L (ref 20–29)
CREATININE: 1 mg/dL (ref 0.57–1.00)
Calcium: 10.1 mg/dL (ref 8.7–10.3)
Chloride: 103 mmol/L (ref 96–106)
GFR calc Af Amer: 68 mL/min/{1.73_m2} (ref >59)
GFR calc non Af Amer: 59 mL/min/{1.73_m2} — ABNORMAL LOW (ref >59)
GLUCOSE: 89 mg/dL (ref 65–99)
Globulin, Total: 2.4 g/dL (ref 1.5–4.5)
Potassium: 4.9 mmol/L (ref 3.5–5.2)
Sodium: 142 mmol/L (ref 134–144)
Total Protein: 6.9 g/dL (ref 6.0–8.5)

## 2017-12-15 LAB — LIPID PANEL
CHOLESTEROL TOTAL: 224 mg/dL — AB (ref 100–199)
Chol/HDL Ratio: 3.6 ratio (ref 0.0–4.4)
HDL: 63 mg/dL (ref >39)
LDL Calculated: 131 mg/dL — ABNORMAL HIGH (ref 0–99)
TRIGLYCERIDES: 148 mg/dL (ref 0–149)
VLDL Cholesterol Cal: 30 mg/dL (ref 5–40)

## 2017-12-15 LAB — HEMOGLOBIN A1C
ESTIMATED AVERAGE GLUCOSE: 126 mg/dL
HEMOGLOBIN A1C: 6 % — AB (ref 4.8–5.6)

## 2017-12-15 LAB — TSH: TSH: 0.588 u[IU]/mL (ref 0.450–4.500)

## 2017-12-18 ENCOUNTER — Other Ambulatory Visit: Payer: Self-pay

## 2017-12-18 ENCOUNTER — Emergency Department
Admission: EM | Admit: 2017-12-18 | Discharge: 2017-12-18 | Disposition: A | Discharge status: Home / Self Care | Payer: Medicare HMO | Attending: Emergency Medicine | Admitting: Emergency Medicine

## 2017-12-18 ENCOUNTER — Encounter: Payer: Self-pay | Admitting: Emergency Medicine

## 2017-12-18 ENCOUNTER — Emergency Department: Payer: Medicare HMO

## 2017-12-18 DIAGNOSIS — Z79899 Other long term (current) drug therapy: Secondary | ICD-10-CM | POA: Insufficient documentation

## 2017-12-18 DIAGNOSIS — E119 Type 2 diabetes mellitus without complications: Secondary | ICD-10-CM | POA: Diagnosis not present

## 2017-12-18 DIAGNOSIS — E039 Hypothyroidism, unspecified: Secondary | ICD-10-CM | POA: Diagnosis not present

## 2017-12-18 DIAGNOSIS — I10 Essential (primary) hypertension: Secondary | ICD-10-CM | POA: Insufficient documentation

## 2017-12-18 DIAGNOSIS — M25562 Pain in left knee: Secondary | ICD-10-CM | POA: Diagnosis not present

## 2017-12-18 MED ORDER — OXYCODONE-ACETAMINOPHEN 7.5-325 MG PO TABS: 1.0000 | ORAL_TABLET | Freq: Four times a day (QID) | ORAL | 0 refills | Status: DC | PRN

## 2017-12-18 MED ORDER — IBUPROFEN 600 MG PO TABS: 600.0000 mg | ORAL_TABLET | Freq: Three times a day (TID) | ORAL | 0 refills | Status: DC | PRN

## 2017-12-18 MED ORDER — OXYCODONE-ACETAMINOPHEN 5-325 MG PO TABS
1.0000 | ORAL_TABLET | Freq: Once | ORAL | Status: AC
  Administered 2017-12-18: 1 via ORAL
  Filled 2017-12-18: qty 1

## 2017-12-18 MED ORDER — IBUPROFEN 600 MG PO TABS
600.0000 mg | ORAL_TABLET | Freq: Once | ORAL | Status: AC
  Administered 2017-12-18: 600 mg via ORAL
  Filled 2017-12-18: qty 1

## 2017-12-18 NOTE — ED Provider Notes (Signed)
[facility] Emergency Department Provider Note   ____________________________________________   First MD Initiated Contact with Patient 12/18/17 1348     (approximate)  I have reviewed the triage vital signs and the nursing notes.   HISTORY  Chief Complaint Knee Pain    HPI Audrey Harrison is a 67 y.o. female patient complain of left knee pain for 1 month which is worsened today.  She says she discussed the knee pain with her PCP and had x-rays taken showing mild degenerative changes.  Patient has not been consulted to orthopedics.  Patient states she was walking up the steps when she felt a "pop" to the lateral aspect of her left knee.  Patient states since accident pain increases with weightbearing.  Patient rates the pain as 10/10.  No palliative measures for complaint.   Past Medical History:  Diagnosis Date  . Allergy   . Depression, major, recurrent (HCC) 11/30/2014  . Diabetes mellitus without complication (HCC)   . Family history of breast cancer 06/08/2014  . Family history of macular degeneration 06/08/2014  . GERD (gastroesophageal reflux disease)   . Hypertension   . Thyroid disease     Patient Active Problem List   Diagnosis Date Noted  . Elevated liver enzymes 05/19/2017  . Allergic rhinitis 06/03/2016  . Hyperlipidemia 08/02/2015  . GERD with esophagitis 11/30/2014  . Blood pressure elevated without history of HTN 10/30/2014  . Family history of premature coronary heart disease 06/08/2014  . H/O alcohol abuse 06/08/2014  . History of colon polyps 06/08/2014  . Hypothyroidism, acquired 06/08/2014  . BMI 35.0-35.9,adult 06/08/2014  . Pre-diabetes 06/08/2014  . Vitamin D deficiency 06/08/2014    Past Surgical History:  Procedure Laterality Date  . COLONOSCOPY  2012   cleared for 10 yrs- [place] MD  . ESOPHAGOGASTRODUODENOSCOPY (EGD) WITH PROPOFOL N/A 06/14/2017   Procedure: ESOPHAGOGASTRODUODENOSCOPY (EGD) WITH PROPOFOL;   Surgeon: Toney Reil, MD;  Location: [facility] ENDOSCOPY;  Service: Gastroenterology;  Laterality: N/A;  . VAGINAL HYSTERECTOMY      Prior to Admission medications   Medication Sig Start Date End Date Taking? Authorizing Provider  cetirizine (ZYRTEC) 10 MG chewable tablet Chew 1 tablet by mouth daily as needed. Reported on 01/30/2015    [provider]  Cholecalciferol (VITAMIN D3) 1000 units CAPS Take 1 capsule (1,000 Units total) by mouth daily. 06/03/16   Plonk, Chrissie Noa, MD  glucose blood (FREESTYLE LITE) test strip Use daily as directed 06/03/16   Schuyler Amor, MD  ibuprofen (ADVIL,MOTRIN) 600 MG tablet Take 1 tablet (600 mg total) by mouth every 8 (eight) hours as needed. 12/18/17   Joni Reining, PA-C  levothyroxine (SYNTHROID, LEVOTHROID) 88 MCG tablet Take 1 tablet (88 mcg total) daily before breakfast by mouth. 12/14/16   Plonk, Chrissie Noa, MD  omeprazole (PRILOSEC) 40 MG capsule TAKE 1 CAPSULE BY MOUTH ONCE DAILY AS NEEDED 12/14/17   Reubin Milan, MD  oxyCODONE-acetaminophen (PERCOCET) 7.5-325 MG tablet Take 1 tablet by mouth every 6 (six) hours as needed. 12/18/17   Joni Reining, PA-C    Allergies Zocor [simvastatin]  Family History  Problem Relation Age of Onset  . Hypertension Mother   . Cancer Sister   . Hypertension Sister   . Cancer Brother   . Diabetes Paternal Grandmother   . Hypertension Paternal Grandmother     Social History Social History   Tobacco Use  . Smoking status: Never Smoker  . Smokeless tobacco: Never Used  Substance Use  Topics  . Alcohol use: No    Alcohol/week: 0.0 standard drinks  . Drug use: No    Review of Systems Constitutional: No fever/chills Eyes: No visual changes. ENT: No sore throat. Cardiovascular: Denies chest pain. Respiratory: Denies shortness of breath. Gastrointestinal: No abdominal pain.  No nausea, no vomiting.  No diarrhea.  No constipation. Genitourinary: Negative for dysuria. Musculoskeletal: Left  knee pain. Skin: Negative for rash. Neurological: Negative for headaches, focal weakness or numbness. Psychiatric:Depression Endocrine:Diabetes, hyperlipidemia, hypertension, and hypothyroidism., Allergic/Immunilogical: Zocor ____________________________________________   PHYSICAL EXAM:  VITAL SIGNS: ED Triage Vitals  Enc Vitals Group     BP 12/18/17 1321 (!) 179/70     Pulse Rate 12/18/17 1319 80     Resp 12/18/17 1319 16     Temp 12/18/17 1319 98.3 F (36.8 C)     Temp Source 12/18/17 1319 Oral     SpO2 12/18/17 1319 98 %     Weight --      Height --      Head Circumference --      Peak Flow --      Pain Score 12/18/17 1318 10     Pain Loc --      Pain Edu? --      Excl. in GC? --    Constitutional: Alert and oriented. Well appearing and in no acute distress. Cardiovascular: Normal rate, regular rhythm. Grossly normal heart sounds.  Good peripheral circulation.  Elevated blood pressure Respiratory: Normal respiratory effort.  No retractions. Lungs CTAB. Musculoskeletal no obvious deformity to the left knee.  Patient is moderate guarding palpation anterior patella.  No crepitus with palpation.  Decreased range of motion with flexion.  No joint effusions.  Ambulates with difficulty favoring the left lower extremity. Neurologic:  Normal speech and language. No gross focal neurologic deficits are appreciated. No gait instability. Skin:  Skin is warm, dry and intact. No rash noted. Psychiatric: Mood and affect are normal. Speech and behavior are normal.  ____________________________________________   LABS (all labs ordered are listed, but only abnormal results are displayed)  Labs Reviewed - No data to display ____________________________________________  EKG   ____________________________________________  RADIOLOGY  ED MD interpretation:    Official radiology report(s): Dg Knee Complete 4 Views Left  Result Date: 12/18/2017 CLINICAL DATA:  Patient walking up  the stairs. Posterior knee pain. Initial encounter. EXAM: LEFT KNEE - COMPLETE 4+ VIEW COMPARISON:  Left knee radiograph 11/29/2017 FINDINGS: Normal anatomic alignment. No evidence for acute fracture or dislocation. No joint effusion. Mild medial and patellofemoral compartment osteophytosis. IMPRESSION: No acute osseous abnormality. Degenerative changes. Electronically Signed   By: Annia Belt M.D.   On: 12/18/2017 14:31    ____________________________________________   PROCEDURES  Procedure(s) performed: None  Procedures  Critical Care performed: No  ____________________________________________   INITIAL IMPRESSION / ASSESSMENT AND PLAN / ED COURSE  As part of my medical decision making, I reviewed the following data within the electronic MEDICAL RECORD NUMBER    Patient presents with increasing left knee pain over the past 3 weeks.  Patient states there was a "popping sensation" when going up stairs a day.  Physical exam grossly unremarkable.  Discussed x-ray findings with patient revealing only moderate degenerative changes.  Patient placed in a knee immobilizer given crutches to support ambulation until evaluation by orthopedics.  Take medication as needed for pain.      ____________________________________________   FINAL CLINICAL IMPRESSION(S) / ED DIAGNOSES  Final diagnoses:  Acute pain of left  knee     ED Discharge Orders         Ordered    oxyCODONE-acetaminophen (PERCOCET) 7.5-325 MG tablet  Every 6 hours PRN     12/18/17 1441    ibuprofen (ADVIL,MOTRIN) 600 MG tablet  Every 8 hours PRN     12/18/17 1441           Note:  This document was prepared using Dragon voice recognition software and may include unintentional dictation errors.    Joni Reining, PA-C 12/18/17 1446    Governor Rooks, MD 12/18/17 1540

## 2017-12-18 NOTE — ED Notes (Signed)
Pt has hx of chronic left knee pain, pt states she was going up the stairs and felt a pop behind her left knee, pt states she is unable to bear any weight on left leg.  Pt states she was seen at her PCP's office a few months ago for left knee pain and was dx'd with arthritis.  Pain is >10/10 on the pain scale.

## 2017-12-18 NOTE — ED Triage Notes (Signed)
Pt to ED via POV, pt states that she was walking up the stairs and went to step into the door when she felt something pop. Pt states that since then she has been unable to put pressure on her left leg. Left knee appears to be slightly swollen. Pt is in NAD at this time.

## 2017-12-18 NOTE — ED Notes (Signed)
XR in room 

## 2017-12-18 NOTE — ED Notes (Signed)
Crutches were ordered but the patient refused them. She has a set at her home she will use.

## 2017-12-24 ENCOUNTER — Other Ambulatory Visit: Payer: Self-pay | Admitting: Orthopedic Surgery

## 2017-12-24 DIAGNOSIS — M1712 Unilateral primary osteoarthritis, left knee: Secondary | ICD-10-CM | POA: Diagnosis not present

## 2017-12-24 DIAGNOSIS — M25562 Pain in left knee: Secondary | ICD-10-CM

## 2017-12-24 DIAGNOSIS — G8929 Other chronic pain: Secondary | ICD-10-CM | POA: Diagnosis not present

## 2017-12-24 DIAGNOSIS — M2392 Unspecified internal derangement of left knee: Secondary | ICD-10-CM | POA: Diagnosis not present

## 2017-12-24 DIAGNOSIS — M25362 Other instability, left knee: Secondary | ICD-10-CM

## 2017-12-24 DIAGNOSIS — E669 Obesity, unspecified: Secondary | ICD-10-CM | POA: Diagnosis not present

## 2017-12-24 DIAGNOSIS — M2352 Chronic instability of knee, left knee: Secondary | ICD-10-CM | POA: Diagnosis not present

## 2018-01-01 ENCOUNTER — Other Ambulatory Visit: Payer: Self-pay | Admitting: Family Medicine

## 2018-01-13 ENCOUNTER — Ambulatory Visit
Admission: RE | Admit: 2018-01-13 | Discharge: 2018-01-13 | Disposition: A | Discharge status: Home / Self Care | Payer: Medicare HMO | Source: Ambulatory Visit | Attending: Orthopedic Surgery | Admitting: Orthopedic Surgery

## 2018-01-13 DIAGNOSIS — X58XXXA Exposure to other specified factors, initial encounter: Secondary | ICD-10-CM | POA: Insufficient documentation

## 2018-01-13 DIAGNOSIS — M23322 Other meniscus derangements, posterior horn of medial meniscus, left knee: Secondary | ICD-10-CM | POA: Diagnosis not present

## 2018-01-13 DIAGNOSIS — S83242A Other tear of medial meniscus, current injury, left knee, initial encounter: Secondary | ICD-10-CM | POA: Insufficient documentation

## 2018-01-13 DIAGNOSIS — M25562 Pain in left knee: Secondary | ICD-10-CM | POA: Diagnosis not present

## 2018-01-13 DIAGNOSIS — G8929 Other chronic pain: Secondary | ICD-10-CM

## 2018-01-13 DIAGNOSIS — M25362 Other instability, left knee: Secondary | ICD-10-CM | POA: Diagnosis not present

## 2018-01-13 DIAGNOSIS — M1712 Unilateral primary osteoarthritis, left knee: Secondary | ICD-10-CM | POA: Diagnosis not present

## 2018-01-24 ENCOUNTER — Ambulatory Visit (INDEPENDENT_AMBULATORY_CARE_PROVIDER_SITE_OTHER): Payer: Medicare HMO

## 2018-01-24 VITALS — BP 152/82 | HR 76 | Temp 98.3°F | Resp 16 | Ht 67.0 in | Wt 223.6 lb

## 2018-01-24 DIAGNOSIS — Z0000 Encounter for general adult medical examination without abnormal findings: Secondary | ICD-10-CM | POA: Diagnosis not present

## 2018-01-24 DIAGNOSIS — Z780 Asymptomatic menopausal state: Secondary | ICD-10-CM | POA: Diagnosis not present

## 2018-01-24 NOTE — Patient Instructions (Signed)
Audrey Harrison , Thank you for taking time to come for your Medicare Wellness Visit. I appreciate your ongoing commitment to your health goals. Please review the following plan we discussed and let me know if I can assist you in the future.   Screening recommendations/referrals: Colonoscopy: done in 2013 repeat in 2023 Mammogram: Please call [phone] to schedule your mammogram and bone density exam. Recommended yearly ophthalmology/optometry visit for glaucoma screening and checkup Recommended yearly dental visit for hygiene and checkup  Vaccinations: Influenza vaccine: done 11/29/17 Pneumococcal vaccine: done 01/16/17 Tdap vaccine: done 06/26/14 Shingles vaccine: Shingrix discussed. Please contact your pharmacy for coverage information.     Advanced directives: Advance directive discussed with you today. I have provided a copy for you to complete at home and have notarized. Once this is complete please bring a copy in to our office so we can scan it into your chart.  Conditions/risks identified: Recommend increasing physical activity to 90 minutes per week.  Next appointment: Please follow up in one year for your Medicare Annual Wellness visit.     Preventive Care 4965 Years and Older, Female Preventive care refers to lifestyle choices and visits with your health care provider that can promote health and wellness. What does preventive care include?  A yearly physical exam. This is also called an annual well check.  Dental exams once or twice a year.  Routine eye exams. Ask your health care provider how often you should have your eyes checked.  Personal lifestyle choices, including:  Daily care of your teeth and gums.  Regular physical activity.  Eating a healthy diet.  Avoiding tobacco and drug use.  Limiting alcohol use.  Practicing safe sex.  Taking low-dose aspirin every day.  Taking vitamin and mineral supplements as recommended by your health care provider. What  happens during an annual well check? The services and screenings done by your health care provider during your annual well check will depend on your age, overall health, lifestyle risk factors, and family history of disease. Counseling  Your health care provider may ask you questions about your:  Alcohol use.  Tobacco use.  Drug use.  Emotional well-being.  Home and relationship well-being.  Sexual activity.  Eating habits.  History of falls.  Memory and ability to understand (cognition).  Work and work Astronomerenvironment.  Reproductive health. Screening  You may have the following tests or measurements:  Height, weight, and BMI.  Blood pressure.  Lipid and cholesterol levels. These may be checked every 5 years, or more frequently if you are over 66 years old.  Skin check.  Lung cancer screening. You may have this screening every year starting at age 66 if you have a 30-pack-year history of smoking and currently smoke or have quit within the past 15 years.  Fecal occult blood test (FOBT) of the stool. You may have this test every year starting at age 66.  Flexible sigmoidoscopy or colonoscopy. You may have a sigmoidoscopy every 5 years or a colonoscopy every 10 years starting at age 66.  Hepatitis C blood test.  Hepatitis B blood test.  Sexually transmitted disease (STD) testing.  Diabetes screening. This is done by checking your blood sugar (glucose) after you have not eaten for a while (fasting). You may have this done every 1-3 years.  Bone density scan. This is done to screen for osteoporosis. You may have this done starting at age 10165.  Mammogram. This may be done every 1-2 years. Talk to your health care  provider about how often you should have regular mammograms. Talk with your health care provider about your test results, treatment options, and if necessary, the need for more tests. Vaccines  Your health care provider may recommend certain vaccines, such  as:  Influenza vaccine. This is recommended every year.  Tetanus, diphtheria, and acellular pertussis (Tdap, Td) vaccine. You may need a Td booster every 10 years.  Zoster vaccine. You may need this after age 45.  Pneumococcal 13-valent conjugate (PCV13) vaccine. One dose is recommended after age 64.  Pneumococcal polysaccharide (PPSV23) vaccine. One dose is recommended after age 24. Talk to your health care provider about which screenings and vaccines you need and how often you need them. This information is not intended to replace advice given to you by your health care provider. Make sure you discuss any questions you have with your health care provider. Document Released: 02/22/2015 Document Revised: 10/16/2015 Document Reviewed: 11/27/2014 Elsevier Interactive Patient Education  2017 [place] Prevention in the Home Falls can cause injuries. They can happen to people of all ages. There are many things you can do to make your home safe and to help prevent falls. What can I do on the outside of my home?  Regularly fix the edges of walkways and driveways and fix any cracks.  Remove anything that might make you trip as you walk through a door, such as a raised step or threshold.  Trim any bushes or trees on the path to your home.  Use bright outdoor lighting.  Clear any walking paths of anything that might make someone trip, such as rocks or tools.  Regularly check to see if handrails are loose or broken. Make sure that both sides of any steps have handrails.  Any raised decks and porches should have guardrails on the edges.  Have any leaves, snow, or ice cleared regularly.  Use sand or salt on walking paths during winter.  Clean up any spills in your garage right away. This includes oil or grease spills. What can I do in the bathroom?  Use night lights.  Install grab bars by the toilet and in the tub and shower. Do not use towel bars as grab bars.  Use  non-skid mats or decals in the tub or shower.  If you need to sit down in the shower, use a plastic, non-slip stool.  Keep the floor dry. Clean up any water that spills on the floor as soon as it happens.  Remove soap buildup in the tub or shower regularly.  Attach bath mats securely with double-sided non-slip rug tape.  Do not have throw rugs and other things on the floor that can make you trip. What can I do in the bedroom?  Use night lights.  Make sure that you have a light by your bed that is easy to reach.  Do not use any sheets or blankets that are too big for your bed. They should not hang down onto the floor.  Have a firm chair that has side arms. You can use this for support while you get dressed.  Do not have throw rugs and other things on the floor that can make you trip. What can I do in the kitchen?  Clean up any spills right away.  Avoid walking on wet floors.  Keep items that you use a lot in easy-to-reach places.  If you need to reach something above you, use a strong step stool that has a grab bar.  Keep electrical cords out of the way.  Do not use floor polish or wax that makes floors slippery. If you must use wax, use non-skid floor wax.  Do not have throw rugs and other things on the floor that can make you trip. What can I do with my stairs?  Do not leave any items on the stairs.  Make sure that there are handrails on both sides of the stairs and use them. Fix handrails that are broken or loose. Make sure that handrails are as long as the stairways.  Check any carpeting to make sure that it is firmly attached to the stairs. Fix any carpet that is loose or worn.  Avoid having throw rugs at the top or bottom of the stairs. If you do have throw rugs, attach them to the floor with carpet tape.  Make sure that you have a light switch at the top of the stairs and the bottom of the stairs. If you do not have them, ask someone to add them for you. What  else can I do to help prevent falls?  Wear shoes that:  Do not have high heels.  Have rubber bottoms.  Are comfortable and fit you well.  Are closed at the toe. Do not wear sandals.  If you use a stepladder:  Make sure that it is fully opened. Do not climb a closed stepladder.  Make sure that both sides of the stepladder are locked into place.  Ask someone to hold it for you, if possible.  Clearly mark and make sure that you can see:  Any grab bars or handrails.  First and last steps.  Where the edge of each step is.  Use tools that help you move around (mobility aids) if they are needed. These include:  Canes.  Walkers.  Scooters.  Crutches.  Turn on the lights when you go into a dark area. Replace any light bulbs as soon as they burn out.  Set up your furniture so you have a clear path. Avoid moving your furniture around.  If any of your floors are uneven, fix them.  If there are any pets around you, be aware of where they are.  Review your medicines with your doctor. Some medicines can make you feel dizzy. This can increase your chance of falling. Ask your doctor what other things that you can do to help prevent falls. This information is not intended to replace advice given to you by your health care provider. Make sure you discuss any questions you have with your health care provider. Document Released: 11/22/2008 Document Revised: 07/04/2015 Document Reviewed: 03/02/2014 Elsevier Interactive Patient Education  2017 [employer].

## 2018-01-24 NOTE — Progress Notes (Signed)
Subjective:   Audrey Harrison is a 67 y.o. female who presents for an Initial Medicare Annual Wellness Visit.  Review of Systems      Cardiac Risk Factors include: diabetes mellitus;obesity (BMI >30kg/m2) advanced age female > 4165     Objective:    Today's Vitals   01/24/18 0857 01/24/18 0858  BP: (!) 152/82   Pulse: 76   Resp: 16   Temp: 98.3 F (36.8 C)   TempSrc: Oral   Weight: 223 lb 9.6 oz (101.4 kg)   Height: 5\' 7"  (1.702 m)   PainSc:  3    Body mass index is 35.02 kg/m.  Advanced Directives 01/24/2018 12/18/2017 06/14/2017 12/03/2016 08/02/2015 05/01/2015 02/15/2015  Does Patient Have a Medical Advance Directive? No No No No No No No  Would patient like information on creating a medical advance directive? Yes (MAU/Ambulatory/Procedural Areas - Information given) No - Patient declined - - - No - patient declined information (No Data)    Current Medications (verified) Outpatient Encounter Medications as of 01/24/2018  Medication Sig  . acetaminophen (TYLENOL 8 HOUR ARTHRITIS PAIN) 650 MG CR tablet Take 650 mg by mouth every 8 (eight) hours as needed for pain.  . cetirizine (ZYRTEC) 10 MG chewable tablet Chew 1 tablet by mouth daily as needed. Reported on 01/30/2015  . Cholecalciferol (VITAMIN D3) 1000 units CAPS Take 1 capsule (1,000 Units total) by mouth daily.  Marland Kitchen. levothyroxine (SYNTHROID, LEVOTHROID) 88 MCG tablet TAKE 1 TABLET BY MOUTH ONCE DAILY BEFORE BREAKFAST  . omeprazole (PRILOSEC) 40 MG capsule TAKE 1 CAPSULE BY MOUTH ONCE DAILY AS NEEDED  . [DISCONTINUED] glucose blood (FREESTYLE LITE) test strip Use daily as directed  . [DISCONTINUED] ibuprofen (ADVIL,MOTRIN) 600 MG tablet Take 1 tablet (600 mg total) by mouth every 8 (eight) hours as needed.  . [DISCONTINUED] oxyCODONE-acetaminophen (PERCOCET) 7.5-325 MG tablet Take 1 tablet by mouth every 6 (six) hours as needed.   No facility-administered encounter medications on file as of 01/24/2018.     Allergies  (verified) Zocor [simvastatin]   History: Past Medical History:  Diagnosis Date  . Allergy   . Arthritis   . Depression, major, recurrent (HCC) 11/30/2014  . Diabetes mellitus without complication (HCC)   . Family history of breast cancer 06/08/2014  . Family history of macular degeneration 06/08/2014  . GERD (gastroesophageal reflux disease)   . Hypertension   . Thyroid disease    Past Surgical History:  Procedure Laterality Date  . COLONOSCOPY  2012   [place] yrs[place]  . ESOPHAGOGASTRODUODENOSCOPY (EGD) WITH PROPOFOL N/A 06/14/2017   Procedure: ESOPHAGOGASTRODUODENOSCOPY (EGD) WITH PROPOFOL;  Surgeon: Toney ReilVanga, Rohini Reddy, MD;  [facility];  Service: Gastroenterology;  Laterality: N/A;  . VAGINAL HYSTERECTOMY     Family History  Problem Relation Age of Onset  . Hypertension Mother   . Alcohol abuse Mother   . Arthritis Mother   . Stroke Mother   . Vision loss Mother   . Alcohol abuse Father   . Heart disease Father   . Cancer Sister   . Hypertension Sister   . Arthritis Sister   . Cancer Brother   . Diabetes Paternal Grandmother   . Hypertension Paternal Grandmother   . Alcohol abuse Brother   . Drug abuse Brother   . Alcohol abuse Son   . Drug abuse Son   . Heart disease Paternal Grandfather    Social History   Socioeconomic History  . Marital status: Divorced  Spouse name: Not on file  . Number of children: 2  . Years of education: Not on file  . Highest education level: Some college, no degree  Occupational History  . Occupation: Medical sales representative: Industrial/product designer    Comment: full time  Social Needs  . Financial resource strain: Not hard at all  . Food insecurity:    Worry: Never true    Inability: Never true  . Transportation needs:    Medical: No    Non-medical: No  Tobacco Use  . Smoking status: Never Smoker  . Smokeless tobacco: Never Used  Substance and Sexual Activity  . Alcohol use: No    Alcohol/week:  0.0 standard drinks  . Drug use: No  . Sexual activity: Not Currently    Birth control/protection: Surgical  Lifestyle  . Physical activity:    Days per week: 0 days    Minutes per session: 0 min  . Stress: To some extent  Relationships  . Social connections:    Talks on phone: More than three times a week    Gets together: More than three times a week    Attends religious service: Never    Active member of club or organization: No    Attends meetings of clubs or organizations: Never    Relationship status: Divorced  Other Topics Concern  . Not on file  Social History Narrative   Pt's son and 64 year old grandson live with her. Works full time at [employer] in [place].    Tobacco Counseling Counseling given: Not Answered   Clinical Intake:  Pre-visit preparation completed: Yes  Pain : 0-10 Pain Score: 3  Pain Type: Acute pain Pain Location: Knee Pain Orientation: Left Pain Descriptors / Indicators: Aching Pain Onset: 1 to 4 weeks ago Pain Frequency: Intermittent     Nutritional Status: BMI > 30  Obese Diabetes: Yes CBG done?: No Did pt. bring in CBG monitor from home?: No   Pt does not check blood sugar stating A1c well controlled and no longer considered diabetic.   How often do you need to have someone help you when you read instructions, pamphlets, or other written materials from your doctor or pharmacy?: 1 - Never What is the last grade level you completed in school?: some college  Interpreter Needed?: No  Information entered by :: Reather Littler LPN   Activities of Daily Living In your present state of health, do you have any difficulty performing the following activities: 01/24/2018 12/14/2017  Hearing? Y N  Comment tinnitus -  Vision? N N  Comment wears glasses -  Difficulty concentrating or making decisions? N N  Walking or climbing stairs? N N  Dressing or bathing? N N  Doing errands, shopping? N N  Preparing Food and eating ? N -  Using the Toilet?  N -  In the past six months, have you accidently leaked urine? N -  Do you have problems with loss of bowel control? N -  Managing your Medications? N -  Managing your Finances? N -  Housekeeping or managing your Housekeeping? N -  Some recent data might be hidden     Immunizations and Health Maintenance Immunization History  Administered Date(s) Administered  . Influenza, High Dose Seasonal PF 11/29/2017  . Influenza,inj,Quad PF,6+ Mos 10/30/2014, 12/03/2016  . Influenza-Unspecified 11/05/2015  . Pneumococcal Conjugate-13 01/16/2017  . Pneumococcal Polysaccharide-23 11/05/2015  . Tdap 06/26/2014  . Zoster 01/01/2012   Health Maintenance Due  Topic  Date Due  . MAMMOGRAM  02/09/2013  . DEXA SCAN  12/19/2016    Patient Care Team: Reubin Milan, MD as PCP - General (Internal Medicine)  Indicate any recent Medical Services you may have received from other than [facility] providers in the past year (date may be approximate).     Assessment:   This is a routine wellness examination for Yasmene.  Hearing/Vision screen Hearing Screening Comments: Pt c/o tinnitus and difficulty hearing certain pitches when people talk.  Vision Screening Comments: Annual vision screenings completed. Pt changing providers due to new insurance.   Dietary issues and exercise activities discussed: Current Exercise Habits: The patient does not participate in regular exercise at present  Goals    . exercise     Recommend increasing physical activity to 90 minutes per week.       Depression Screen PHQ 2/9 Scores 01/24/2018 11/29/2017 05/18/2017 08/02/2015 02/15/2015  PHQ - 2 Score 2 0 0 2 2  PHQ- 9 Score 5 0 - 7 7    Fall Risk Fall Risk  01/24/2018 05/18/2017 03/07/2015 02/15/2015  Falls in the past year? 0 No No No  Comment - - - sometimes gets dizzy due to medication  Number falls in past yr: 0 - - -   FALL RISK PREVENTION PERTAINING TO THE HOME:  Any stairs in or around the home WITH handrails? Yes   Home free of loose throw rugs in walkways, pet beds, electrical cords, etc? Yes  Adequate lighting in your home to reduce risk of falls? Yes   ASSISTIVE DEVICES UTILIZED TO PREVENT FALLS:  Life alert? No  Use of a cane, walker or w/c? No  Grab bars in the bathroom? No  Shower chair or bench in shower? No  Elevated toilet seat or a handicapped toilet? No   DME ORDERS:  DME order needed?  No   TIMED UP AND GO:  Was the test performed? Yes .  Length of time to ambulate 10 feet: 6 sec.   GAIT:  Appearance of gait: Gait stead-fast and without the use of an assistive device.   Education: Fall risk prevention has been discussed.  Intervention(s) required? No   Cognitive Function: pt declined 6CIT       Screening Tests Health Maintenance  Topic Date Due  . MAMMOGRAM  02/09/2013  . DEXA SCAN  12/19/2016  . URINE MICROALBUMIN  05/19/2018  . PNA vac Low Risk Adult (2 of 2 - PPSV23) 11/04/2020  . COLONOSCOPY  02/09/2021  . TETANUS/TDAP  06/25/2024  . INFLUENZA VACCINE  Completed  . Hepatitis C Screening  Completed    Qualifies for Shingles Vaccine? Yes  Zostavax completed 2013. Due for Shingrix. Education has been provided regarding the importance of this vaccine. Pt has been advised to call insurance company to determine out of pocket expense. Advised may also receive vaccine at local pharmacy or Health Dept. Verbalized acceptance and understanding.  Tdap: Up to date  Flu Vaccine: Up to date  Pneumococcal Vaccine: Up to date   Cancer Screenings:  Colorectal Screening: Completed 2013 in [place] per pt. Repeat every 10 years;  Mammogram: Completed 2014. Repeat every year; No longer required. Ordered 12/14/17. Pt provided with contact information and advised to call to schedule appt.   Bone Density: Ordered today. Pt provided with contact information and advised to call to schedule appt.   Lung Cancer Screening: (Low Dose CT Chest recommended if Age 55-80 years, 30  pack-year currently smoking OR have quit  w/in 15years.) does not qualify.   Additional Screening:  Hepatitis C Screening: does qualify; Completed 05/27/17  Vision Screening: Recommended annual ophthalmology exams for early detection of glaucoma and other disorders of the eye. Is the patient up to date with their annual eye exam?  Yes  Who is the provider or what is the name of the office in which the pt attends annual eye exams? Last eye dr in [place], pt in process of changing providers based on insurance.  Dental Screening: Recommended annual dental exams for proper oral hygiene  Community Resource Referral:  CRR required this visit?  No      Plan:    I have personally reviewed and addressed the Medicare Annual Wellness questionnaire and have noted the following in the patient's chart:  A. Medical and social history B. Use of alcohol, tobacco or illicit drugs  C. Current medications and supplements D. Functional ability and status E.  Nutritional status F.  Physical activity G. Advance directives H. List of other physicians I.  Hospitalizations, surgeries, and ER visits in previous 12 months J.  Vitals K. Screenings such as hearing and vision if needed, cognitive and depression L. Referrals and appointments   In addition, I have reviewed and discussed with patient certain preventive protocols, quality metrics, and best practice recommendations. A written personalized care plan for preventive services as well as general preventive health recommendations were provided to patient.   Signed,  Reather Littler, LPN Nurse Health Advisor   Nurse Notes: pt's BP elevated during visit but states that is common at doctor appts. She has appt with rheumatology next week and has blood pressure monitor at home. Pt advised to check and notify office of elevated values. First reading 160/72 second reading 152/82.

## 2018-03-15 ENCOUNTER — Ambulatory Visit
Admission: RE | Admit: 2018-03-15 | Discharge: 2018-03-15 | Disposition: A | Discharge status: Home / Self Care | Payer: Medicare HMO | Source: Ambulatory Visit | Attending: Internal Medicine | Admitting: Internal Medicine

## 2018-03-15 DIAGNOSIS — Z780 Asymptomatic menopausal state: Secondary | ICD-10-CM | POA: Diagnosis not present

## 2018-03-15 DIAGNOSIS — Z13820 Encounter for screening for osteoporosis: Secondary | ICD-10-CM | POA: Diagnosis not present

## 2018-03-15 DIAGNOSIS — Z1231 Encounter for screening mammogram for malignant neoplasm of breast: Secondary | ICD-10-CM | POA: Diagnosis not present

## 2018-03-16 NOTE — Progress Notes (Signed)
Patient informed on [facility] DEXA is normal. Repeat in 3-5 years. Callback if any questions.

## 2018-06-15 ENCOUNTER — Encounter: Payer: Self-pay | Admitting: Internal Medicine

## 2018-06-15 ENCOUNTER — Ambulatory Visit (INDEPENDENT_AMBULATORY_CARE_PROVIDER_SITE_OTHER): Payer: Medicare HMO | Admitting: Internal Medicine

## 2018-06-15 ENCOUNTER — Other Ambulatory Visit: Payer: Self-pay

## 2018-06-15 VITALS — BP 138/74 | HR 68 | Ht 67.0 in | Wt 224.0 lb

## 2018-06-15 DIAGNOSIS — B354 Tinea corporis: Secondary | ICD-10-CM | POA: Diagnosis not present

## 2018-06-15 DIAGNOSIS — K2100 Gastro-esophageal reflux disease with esophagitis, without bleeding: Secondary | ICD-10-CM

## 2018-06-15 DIAGNOSIS — Z6835 Body mass index (BMI) 35.0-35.9, adult: Secondary | ICD-10-CM

## 2018-06-15 DIAGNOSIS — E039 Hypothyroidism, unspecified: Secondary | ICD-10-CM | POA: Diagnosis not present

## 2018-06-15 DIAGNOSIS — R7303 Prediabetes: Secondary | ICD-10-CM | POA: Diagnosis not present

## 2018-06-15 DIAGNOSIS — E782 Mixed hyperlipidemia: Secondary | ICD-10-CM | POA: Diagnosis not present

## 2018-06-15 DIAGNOSIS — Z0000 Encounter for general adult medical examination without abnormal findings: Secondary | ICD-10-CM

## 2018-06-15 LAB — POCT URINALYSIS DIPSTICK
Bilirubin, UA: NEGATIVE
Blood, UA: NEGATIVE
Glucose, UA: NEGATIVE
Ketones, UA: NEGATIVE
Nitrite, UA: NEGATIVE
Protein, UA: NEGATIVE
Spec Grav, UA: <=1.005 — AB (ref 1.010–1.025)
Urobilinogen, UA: 0.2 E.U./dL
pH, UA: 6.5 (ref 5.0–8.0)

## 2018-06-15 MED ORDER — NYSTATIN 100000 UNIT/GM EX CREA: 1.0000 "application " | TOPICAL_CREAM | Freq: Two times a day (BID) | CUTANEOUS | 0 refills | Status: AC

## 2018-06-15 NOTE — Progress Notes (Signed)
Date:  06/15/2018   Name:  Audrey Harrison   DOB:  05-Oct-1950   MRN:  [id number]   Chief Complaint: Annual Exam (Breast Exam, No pap. ) Audrey Harrison is a 68 y.o. female who presents today for her Complete Annual Exam. She feels well. She reports exercising some. She reports she is sleeping fairly well - interrupted 6 hours. She is working from home at night.  Moving to [place] this week and will be changing PCPs. She may consider continuing to come here.  Mammogram 03/15/2018 Bi-Rads 1 DEXA 03/15/18 normal Colonoscopy 2013 - repeat 10 yrs PPV-23 11/05/15 Prevnar-13 01/16/17 Tdap 06/26/14  Gastroesophageal Reflux  She reports no abdominal pain, no chest pain, no coughing or no wheezing. This is a recurrent problem. The problem occurs occasionally. Pertinent negatives include no fatigue or weight loss. She has tried a PPI for the symptoms. The treatment provided significant relief.  Thyroid Problem  Presents for follow-up visit. Patient reports no anxiety, constipation, diarrhea, fatigue, heat intolerance, leg swelling, palpitations, tremors or weight loss. The symptoms have been stable.  Diabetes  She presents for her follow-up diabetic visit. Diabetes type: pre-diabetes. Her disease course has been stable. Pertinent negatives for hypoglycemia include no dizziness, headaches, nervousness/anxiousness or tremors. Pertinent negatives for diabetes include no chest pain, no fatigue, no polydipsia, no polyuria and no weight loss. Current diabetic treatment includes diet. Her weight is stable.   Lab Results  Component Value Date   HGBA1C 6.0 (H) 12/14/2017   Lab Results  Component Value Date   CREATININE 1.00 12/14/2017   BUN 12 12/14/2017   NA 142 12/14/2017   K 4.9 12/14/2017   CL 103 12/14/2017   CO2 22 12/14/2017   Lab Results  Component Value Date   TSH 0.588 12/14/2017     Review of Systems  Constitutional: Negative for chills, fatigue, fever and weight loss.  HENT:  Negative for congestion, hearing loss, tinnitus, trouble swallowing and voice change.   Eyes: Negative for visual disturbance.  Respiratory: Negative for cough, chest tightness, shortness of breath and wheezing.   Cardiovascular: Negative for chest pain, palpitations and leg swelling.  Gastrointestinal: Negative for abdominal pain, constipation, diarrhea and vomiting.       Gerd   Endocrine: Negative for heat intolerance, polydipsia and polyuria.  Genitourinary: Negative for dysuria, frequency, genital sores, vaginal bleeding and vaginal discharge.  Musculoskeletal: Negative for arthralgias, gait problem and joint swelling.  Skin: Positive for rash. Negative for color change.  Allergic/Immunologic: Positive for environmental allergies.  Neurological: Negative for dizziness, tremors, light-headedness and headaches.  Hematological: Negative for adenopathy. Does not bruise/bleed easily.  Psychiatric/Behavioral: Positive for sleep disturbance. Negative for dysphoric mood. The patient is not nervous/anxious.     Patient Active Problem List   Diagnosis Date Noted  . Primary osteoarthritis of left knee 12/24/2017  . Elevated liver enzymes 05/19/2017  . Allergic rhinitis 06/03/2016  . Hyperlipidemia 08/02/2015  . GERD with esophagitis 11/30/2014  . Blood pressure elevated without history of HTN 10/30/2014  . Family history of premature coronary heart disease 06/08/2014  . H/O alcohol abuse 06/08/2014  . History of colon polyps 06/08/2014  . Hypothyroidism, acquired 06/08/2014  . BMI 35.0-35.9,adult 06/08/2014  . Pre-diabetes 06/08/2014  . Vitamin D deficiency 06/08/2014    Allergies  Allergen Reactions  . Zocor [Simvastatin] Other (See Comments)    Myalgias    Past Surgical History:  Procedure Laterality Date  . COLONOSCOPY  2012   cleared  for 10 yrs- [place] MD  . ESOPHAGOGASTRODUODENOSCOPY (EGD) WITH PROPOFOL N/A 06/14/2017   Procedure: ESOPHAGOGASTRODUODENOSCOPY (EGD) WITH  PROPOFOL;  Surgeon: Toney Reil, MD;  Location[facility]COPY;  Service: Gastroenterology;  Laterality: N/A;  . VAGINAL HYSTERECTOMY      Social History   Tobacco Use  . Smoking status: Never Smoker  . Smokeless tobacco: Never Used  Substance Use Topics  . Alcohol use: No    Alcohol/week: 0.0 standard drinks  . Drug use: No     Medication list has been reviewed and updated.  Current Meds  Medication Sig  . acetaminophen (TYLENOL 8 HOUR ARTHRITIS PAIN) 650 MG CR tablet Take 650 mg by mouth every 8 (eight) hours as needed for pain.  . cetirizine (ZYRTEC) 10 MG chewable tablet Chew 1 tablet by mouth daily as needed. Reported on 01/30/2015  . levothyroxine (SYNTHROID, LEVOTHROID) 88 MCG tablet TAKE 1 TABLET BY MOUTH ONCE DAILY BEFORE BREAKFAST  . omeprazole (PRILOSEC) 40 MG capsule TAKE 1 CAPSULE BY MOUTH ONCE DAILY AS NEEDED    PHQ 2/9 Scores 06/15/2018 01/24/2018 11/29/2017 05/18/2017  PHQ - 2 Score 1 2 0 0  PHQ- 9 Score - 5 0 -    BP Readings from Last 3 Encounters:  06/15/18 138/74  01/24/18 (!) 152/82  12/18/17 (!) 159/77    Physical Exam Vitals signs and nursing note reviewed.  Constitutional:      General: She is not in acute distress.    Appearance: She is well-developed.  HENT:     Head: Normocephalic and atraumatic.     Right Ear: Tympanic membrane and ear canal normal.     Left Ear: Tympanic membrane and ear canal normal.     Nose:     Right Sinus: No maxillary sinus tenderness.     Left Sinus: No maxillary sinus tenderness.     Mouth/Throat:     Pharynx: Uvula midline.  Eyes:     General: No scleral icterus.       Right eye: No discharge.        Left eye: No discharge.     Conjunctiva/sclera: Conjunctivae normal.  Neck:     Musculoskeletal: Normal range of motion. No erythema.     Thyroid: No thyromegaly.     Vascular: No carotid bruit.  Cardiovascular:     Rate and Rhythm: Normal rate and regular rhythm.     Pulses: Normal pulses.     Heart  sounds: Normal heart sounds.  Pulmonary:     Effort: Pulmonary effort is normal. No respiratory distress.     Breath sounds: No wheezing.  Chest:     Breasts:        Right: No mass, nipple discharge, skin change or tenderness.        Left: No mass, nipple discharge, skin change or tenderness.  Abdominal:     General: Bowel sounds are normal.     Palpations: Abdomen is soft.     Tenderness: There is no abdominal tenderness.  Musculoskeletal: Normal range of motion.     Right lower leg: No edema.     Left lower leg: No edema.  Lymphadenopathy:     Cervical: No cervical adenopathy.  Skin:    General: Skin is warm and dry.     Findings: Rash present.     Comments: Tinea rash under both breasts  Neurological:     Mental Status: She is alert and oriented to person, place, and time.  Cranial Nerves: No cranial nerve deficit.     Sensory: No sensory deficit.     Deep Tendon Reflexes: Reflexes are normal and symmetric.  Psychiatric:        Speech: Speech normal.        Behavior: Behavior normal.        Thought Content: Thought content normal.     Wt Readings from Last 3 Encounters:  06/15/18 224 lb (101.6 kg)  01/24/18 223 lb 9.6 oz (101.4 kg)  12/14/17 224 lb (101.6 kg)    BP 138/74   Pulse 68   Ht 5\' 7"  (1.702 m)   Wt 224 lb (101.6 kg)   SpO2 96%   BMI 35.08 kg/m   Assessment and Plan: 1. Annual physical exam Normal exam except for weight - recommend diet and exercise Mammogram done recently - POCT urinalysis dipstick  2. Hypothyroidism, acquired supplemented - TSH  3. GERD with esophagitis Doing well on PPI - CBC with Differential/Platelet  4. Pre-diabetes Continue diet, begin regular exercise - Hemoglobin A1c  5. Mixed hyperlipidemia Check labs - Comprehensive metabolic panel - Lipid panel  6. BMI 35.0-35.9,adult Nutrition and exercise counseling  7. Tinea corporis - nystatin cream (MYCOSTATIN); Apply 1 application topically 2 (two) times daily.   Dispense: 30 g; Refill: 0   Partially dictated using Animal nutritionistDragon software. Any errors are unintentional.  Bari EdwardLaura Berglund, MD [facility]  06/15/2018

## 2018-06-15 NOTE — Patient Instructions (Addendum)
I recommend a [facility] PCP in [place] who uses Epic EMR - for ease of record transfers.  Health Maintenance for Postmenopausal Women Menopause is a normal process in which your reproductive ability comes to an end. This process happens gradually over a span of months to years, usually between the ages of 72 and 46. Menopause is complete when you have missed 12 consecutive menstrual periods. It is important to talk with your health care provider about some of the most common conditions that affect postmenopausal women, such as heart disease, cancer, and bone loss (osteoporosis). Adopting a healthy lifestyle and getting preventive care can help to promote your health and wellness. Those actions can also lower your chances of developing some of these common conditions. What should I know about menopause? During menopause, you may experience a number of symptoms, such as:  Moderate-to-severe hot flashes.  Night sweats.  Decrease in sex drive.  Mood swings.  Headaches.  Tiredness.  Irritability.  Memory problems.  Insomnia. Choosing to treat or not to treat menopausal changes is an individual decision that you make with your health care provider. What should I know about hormone replacement therapy and supplements? Hormone therapy products are effective for treating symptoms that are associated with menopause, such as hot flashes and night sweats. Hormone replacement carries certain risks, especially as you become older. If you are thinking about using estrogen or estrogen with progestin treatments, discuss the benefits and risks with your health care provider. What should I know about heart disease and stroke? Heart disease, heart attack, and stroke become more likely as you age. This may be due, in part, to the hormonal changes that your body experiences during menopause. These can affect how your body processes dietary fats, triglycerides, and cholesterol. Heart attack and stroke are both  medical emergencies. There are many things that you can do to help prevent heart disease and stroke:  Have your blood pressure checked at least every 1-2 years. High blood pressure causes heart disease and increases the risk of stroke.  If you are 25-71 years old, ask your health care provider if you should take aspirin to prevent a heart attack or a stroke.  Do not use any tobacco products, including cigarettes, chewing tobacco, or electronic cigarettes. If you need help quitting, ask your health care provider.  It is important to eat a healthy diet and maintain a healthy weight. ? Be sure to include plenty of vegetables, fruits, low-fat dairy products, and lean protein. ? Avoid eating foods that are high in solid fats, added sugars, or salt (sodium).  Get regular exercise. This is one of the most important things that you can do for your health. ? Try to exercise for at least 150 minutes each week. The type of exercise that you do should increase your heart rate and make you sweat. This is known as moderate-intensity exercise. ? Try to do strengthening exercises at least twice each week. Do these in addition to the moderate-intensity exercise.  Know your numbers.Ask your health care provider to check your cholesterol and your blood glucose. Continue to have your blood tested as directed by your health care provider.  What should I know about cancer screening? There are several types of cancer. Take the following steps to reduce your risk and to catch any cancer development as early as possible. Breast Cancer  Practice breast self-awareness. ? This means understanding how your breasts normally appear and feel. ? It also means doing regular breast self-exams. Let  your health care provider know about any changes, no matter how small.  If you are 65 or older, have a clinician do a breast exam (clinical breast exam or CBE) every year. Depending on your age, family history, and medical  history, it may be recommended that you also have a yearly breast X-ray (mammogram).  If you have a family history of breast cancer, talk with your health care provider about genetic screening.  If you are at high risk for breast cancer, talk with your health care provider about having an MRI and a mammogram every year.  Breast cancer (BRCA) gene test is recommended for women who have family members with BRCA-related cancers. Results of the assessment will determine the need for genetic counseling and BRCA1 and for BRCA2 testing. BRCA-related cancers include these types: ? Breast. This occurs in males or females. ? Ovarian. ? Tubal. This may also be called fallopian tube cancer. ? Cancer of the abdominal or pelvic lining (peritoneal cancer). ? Prostate. ? Pancreatic. Cervical, Uterine, and Ovarian Cancer Your health care provider may recommend that you be screened regularly for cancer of the pelvic organs. These include your ovaries, uterus, and vagina. This screening involves a pelvic exam, which includes checking for microscopic changes to the surface of your cervix (Pap test).  For women ages 21-65, health care providers may recommend a pelvic exam and a Pap test every three years. For women ages 46-65, they may recommend the Pap test and pelvic exam, combined with testing for human papilloma virus (HPV), every five years. Some types of HPV increase your risk of cervical cancer. Testing for HPV may also be done on women of any age who have unclear Pap test results.  Other health care providers may not recommend any screening for nonpregnant women who are considered low risk for pelvic cancer and have no symptoms. Ask your health care provider if a screening pelvic exam is right for you.  If you have had past treatment for cervical cancer or a condition that could lead to cancer, you need Pap tests and screening for cancer for at least 20 years after your treatment. If Pap tests have been  discontinued for you, your risk factors (such as having a new sexual partner) need to be reassessed to determine if you should start having screenings again. Some women have medical problems that increase the chance of getting cervical cancer. In these cases, your health care provider may recommend that you have screening and Pap tests more often.  If you have a family history of uterine cancer or ovarian cancer, talk with your health care provider about genetic screening.  If you have vaginal bleeding after reaching menopause, tell your health care provider.  There are currently no reliable tests available to screen for ovarian cancer. Lung Cancer Lung cancer screening is recommended for adults 21-37 years old who are at high risk for lung cancer because of a history of smoking. A yearly low-dose CT scan of the lungs is recommended if you:  Currently smoke.  Have a history of at least 30 pack-years of smoking and you currently smoke or have quit within the past 15 years. A pack-year is smoking an average of one pack of cigarettes per day for one year. Yearly screening should:  Continue until it has been 15 years since you quit.  Stop if you develop a health problem that would prevent you from having lung cancer treatment. Colorectal Cancer  This type of cancer can be detected  and can often be prevented.  Routine colorectal cancer screening usually begins at age 27 and continues through age 41.  If you have risk factors for colon cancer, your health care provider may recommend that you be screened at an earlier age.  If you have a family history of colorectal cancer, talk with your health care provider about genetic screening.  Your health care provider may also recommend using home test kits to check for hidden blood in your stool.  A small camera at the end of a tube can be used to examine your colon directly (sigmoidoscopy or colonoscopy). This is done to check for the earliest forms  of colorectal cancer.  Direct examination of the colon should be repeated every 5-10 years until age 54. However, if early forms of precancerous polyps or small growths are found or if you have a family history or genetic risk for colorectal cancer, you may need to be screened more often. Skin Cancer  Check your skin from head to toe regularly.  Monitor any moles. Be sure to tell your health care provider: ? About any new moles or changes in moles, especially if there is a change in a mole's shape or color. ? If you have a mole that is larger than the size of a pencil eraser.  If any of your family members has a history of skin cancer, especially at a young age, talk with your health care provider about genetic screening.  Always use sunscreen. Apply sunscreen liberally and repeatedly throughout the day.  Whenever you are outside, protect yourself by wearing long sleeves, pants, a wide-brimmed hat, and sunglasses. What should I know about osteoporosis? Osteoporosis is a condition in which bone destruction happens more quickly than new bone creation. After menopause, you may be at an increased risk for osteoporosis. To help prevent osteoporosis or the bone fractures that can happen because of osteoporosis, the following is recommended:  If you are 20-60 years old, get at least 1,000 mg of calcium and at least 600 mg of vitamin D per day.  If you are older than age 36 but younger than age 37, get at least 1,200 mg of calcium and at least 600 mg of vitamin D per day.  If you are older than age 31, get at least 1,200 mg of calcium and at least 800 mg of vitamin D per day. Smoking and excessive alcohol intake increase the risk of osteoporosis. Eat foods that are rich in calcium and vitamin D, and do weight-bearing exercises several times each week as directed by your health care provider. What should I know about how menopause affects my mental health? Depression may occur at any age, but it is  more common as you become older. Common symptoms of depression include:  Low or sad mood.  Changes in sleep patterns.  Changes in appetite or eating patterns.  Feeling an overall lack of motivation or enjoyment of activities that you previously enjoyed.  Frequent crying spells. Talk with your health care provider if you think that you are experiencing depression. What should I know about immunizations? It is important that you get and maintain your immunizations. These include:  Tetanus, diphtheria, and pertussis (Tdap) booster vaccine.  Influenza every year before the flu season begins.  Pneumonia vaccine.  Shingles vaccine. Your health care provider may also recommend other immunizations. This information is not intended to replace advice given to you by your health care provider. Make sure you discuss any questions you have  with your health care provider. Document Released: 03/20/2005 Document Revised: 08/16/2015 Document Reviewed: 10/30/2014 Elsevier Interactive Patient Education  2019 [employer].

## 2018-06-16 LAB — COMPREHENSIVE METABOLIC PANEL
ALT: 23 IU/L (ref 0–32)
AST: 24 IU/L (ref 0–40)
Albumin/Globulin Ratio: 2.4 — ABNORMAL HIGH (ref 1.2–2.2)
Albumin: 4.7 g/dL (ref 3.8–4.8)
Alkaline Phosphatase: 69 IU/L (ref 39–117)
BUN/Creatinine Ratio: 15 (ref 12–28)
BUN: 13 mg/dL (ref 8–27)
Bilirubin Total: 0.5 mg/dL (ref 0.0–1.2)
CO2: 22 mmol/L (ref 20–29)
Calcium: 10 mg/dL (ref 8.7–10.3)
Chloride: 105 mmol/L (ref 96–106)
Creatinine, Ser: 0.87 mg/dL (ref 0.57–1.00)
GFR calc Af Amer: 80 mL/min/{1.73_m2} (ref >59)
GFR calc non Af Amer: 70 mL/min/{1.73_m2} (ref >59)
Globulin, Total: 2 g/dL (ref 1.5–4.5)
Glucose: 97 mg/dL (ref 65–99)
Potassium: 4.6 mmol/L (ref 3.5–5.2)
Sodium: 142 mmol/L (ref 134–144)
Total Protein: 6.7 g/dL (ref 6.0–8.5)

## 2018-06-16 LAB — CBC WITH DIFFERENTIAL/PLATELET
Basophils Absolute: 0.1 10*3/uL (ref 0.0–0.2)
Basos: 1 %
EOS (ABSOLUTE): 0.1 10*3/uL (ref 0.0–0.4)
Eos: 2 %
Hematocrit: 37 % (ref 34.0–46.6)
Hemoglobin: 12.4 g/dL (ref 11.1–15.9)
Immature Grans (Abs): 0 10*3/uL (ref 0.0–0.1)
Immature Granulocytes: 0 %
Lymphocytes Absolute: 2.4 10*3/uL (ref 0.7–3.1)
Lymphs: 36 %
MCH: 30.3 pg (ref 26.6–33.0)
MCHC: 33.5 g/dL (ref 31.5–35.7)
MCV: 91 fL (ref 79–97)
Monocytes Absolute: 0.6 10*3/uL (ref 0.1–0.9)
Monocytes: 9 %
Neutrophils Absolute: 3.5 10*3/uL (ref 1.4–7.0)
Neutrophils: 52 %
Platelets: 273 10*3/uL (ref 150–450)
RBC: 4.09 x10E6/uL (ref 3.77–5.28)
RDW: 13.1 % (ref 11.7–15.4)
WBC: 6.8 10*3/uL (ref 3.4–10.8)

## 2018-06-16 LAB — LIPID PANEL
Chol/HDL Ratio: 3.7 ratio (ref 0.0–4.4)
Cholesterol, Total: 217 mg/dL — ABNORMAL HIGH (ref 100–199)
HDL: 59 mg/dL (ref >39)
LDL Calculated: 128 mg/dL — ABNORMAL HIGH (ref 0–99)
Triglycerides: 151 mg/dL — ABNORMAL HIGH (ref 0–149)
VLDL Cholesterol Cal: 30 mg/dL (ref 5–40)

## 2018-06-16 LAB — HEMOGLOBIN A1C
Est. average glucose Bld gHb Est-mCnc: 123 mg/dL
Hgb A1c MFr Bld: 5.9 % — ABNORMAL HIGH (ref 4.8–5.6)

## 2018-06-16 LAB — TSH: TSH: 0.928 u[IU]/mL (ref 0.450–4.500)

## 2018-06-17 ENCOUNTER — Telehealth: Payer: Self-pay

## 2018-08-19 NOTE — Telephone Encounter (Signed)
ERROR

## 2018-11-08 DIAGNOSIS — J302 Other seasonal allergic rhinitis: Secondary | ICD-10-CM | POA: Diagnosis not present

## 2018-11-08 DIAGNOSIS — E669 Obesity, unspecified: Secondary | ICD-10-CM | POA: Diagnosis not present

## 2018-11-08 DIAGNOSIS — E032 Hypothyroidism due to medicaments and other exogenous substances: Secondary | ICD-10-CM | POA: Diagnosis not present

## 2018-11-08 DIAGNOSIS — I10 Essential (primary) hypertension: Secondary | ICD-10-CM | POA: Diagnosis not present

## 2018-11-08 DIAGNOSIS — Z6834 Body mass index (BMI) 34.0-34.9, adult: Secondary | ICD-10-CM | POA: Diagnosis not present

## 2018-11-08 DIAGNOSIS — R7303 Prediabetes: Secondary | ICD-10-CM | POA: Diagnosis not present

## 2018-11-08 DIAGNOSIS — Z23 Encounter for immunization: Secondary | ICD-10-CM | POA: Diagnosis not present

## 2018-11-08 DIAGNOSIS — Z7689 Persons encountering health services in other specified circumstances: Secondary | ICD-10-CM | POA: Diagnosis not present

## 2018-11-08 DIAGNOSIS — G4700 Insomnia, unspecified: Secondary | ICD-10-CM | POA: Diagnosis not present

## 2018-12-23 DIAGNOSIS — I10 Essential (primary) hypertension: Secondary | ICD-10-CM | POA: Diagnosis not present

## 2019-01-24 DIAGNOSIS — I10 Essential (primary) hypertension: Secondary | ICD-10-CM | POA: Diagnosis not present

## 2019-01-24 DIAGNOSIS — H5500 Unspecified nystagmus: Secondary | ICD-10-CM | POA: Diagnosis not present

## 2019-01-24 DIAGNOSIS — R42 Dizziness and giddiness: Secondary | ICD-10-CM | POA: Diagnosis not present

## 2019-01-24 DIAGNOSIS — R252 Cramp and spasm: Secondary | ICD-10-CM | POA: Diagnosis not present

## 2019-02-08 ENCOUNTER — Ambulatory Visit: Payer: Self-pay

## 2020-09-11 IMAGING — MR MR KNEE*L* W/O CM
7 series · 40 of 40 positions shown · non-contrast
Comparison: Plain films left knee 12/18/2017.

CLINICAL DATA: Left knee tightness and pain since the patient felt
a pop in the posterior aspect of the knee when she was going up
stairs 12/18/2017.

EXAM:
MRI OF THE LEFT KNEE WITHOUT CONTRAST
TECHNIQUE: Multiplanar, multisequence MR imaging of the knee was performed. No
intravenous contrast was administered.

[Series 3: T2 fat-sat · axial · 4.0mm · 0.53mm/px · z∈[-92,+88]mm · 6 of 37 slices shown (1 of 3)]
[im 1/37]
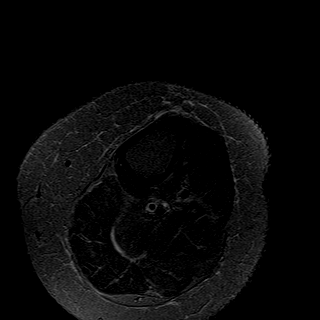
[im 8/37]
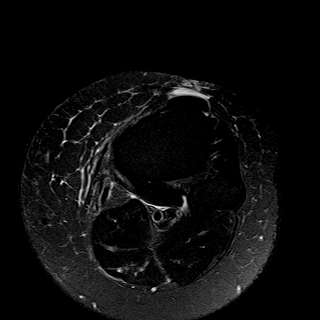
[im 15/37]
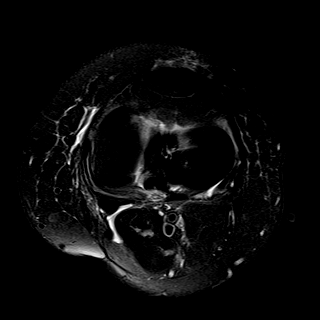
[im 22/37]
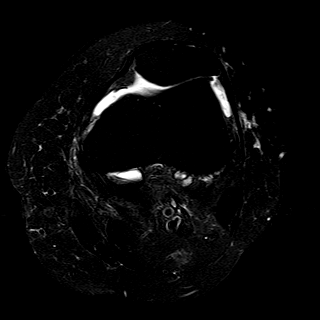
[im 29/37]
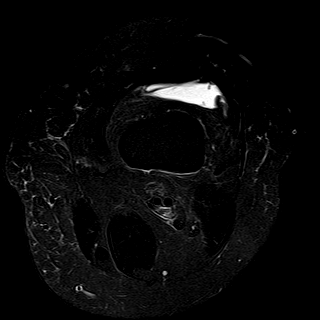
[im 37/37]
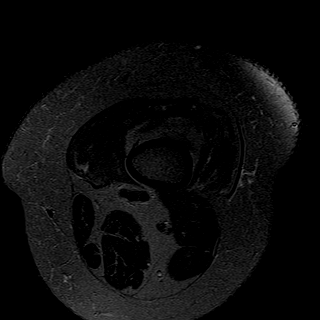

[Series 4: T1 · coronal · 4.0mm · 0.62mm/px · 6 of 31 slices shown]
[im 1/31]
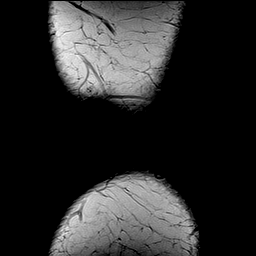
[im 7/31]
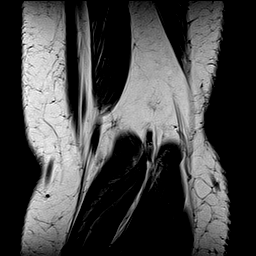
[im 13/31]
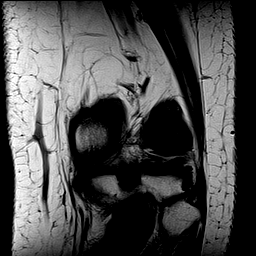
[im 19/31]
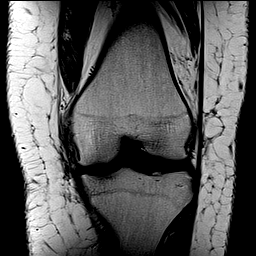
[im 25/31]
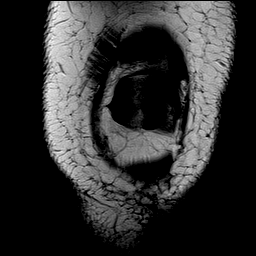
[im 31/31]
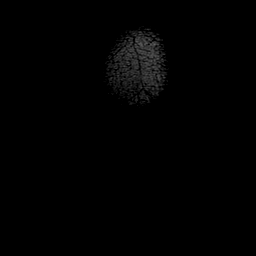

[Series 5: PD fat-sat · sagittal · 3.0mm · 0.62mm/px · 6 of 35 slices shown (1 of 3)]
[im 1/35]
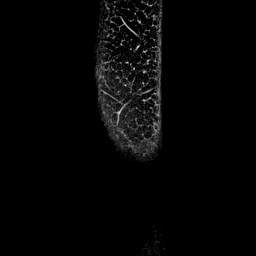
[im 7/35]
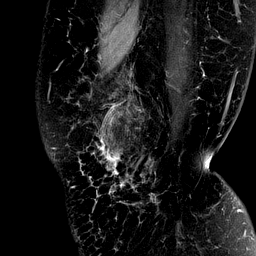
[im 14/35]
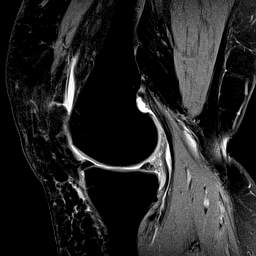
[im 21/35]
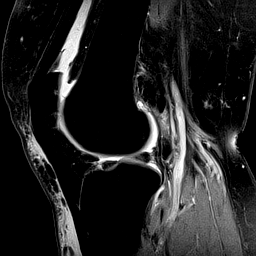
[im 28/35]
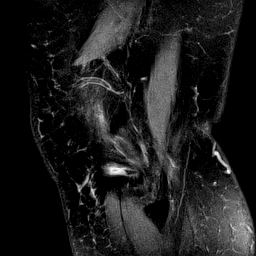
[im 35/35]
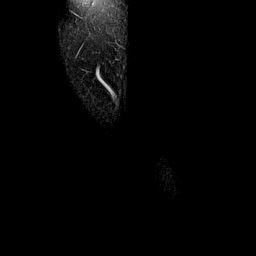

[Series 6: T2 fat-sat · coronal · 4.0mm · 0.62mm/px · 6 of 31 slices shown (2 of 3)]
[im 1/31]
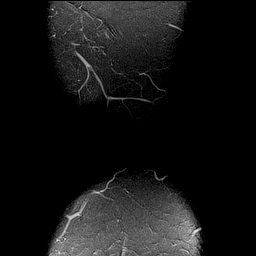
[im 7/31]
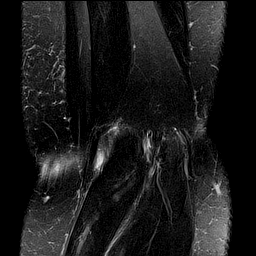
[im 13/31]
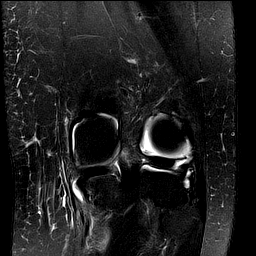
[im 19/31]
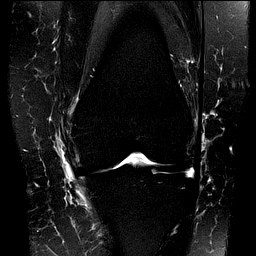
[im 25/31]
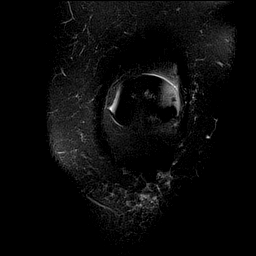
[im 31/31]
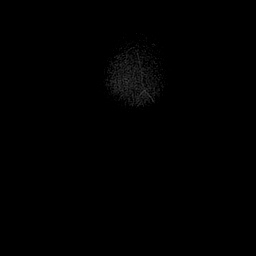

[Series 7: PD fat-sat · coronal · 4.0mm · 0.62mm/px · 6 of 31 slices shown (2 of 3)]
[im 1/31]
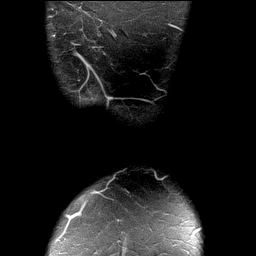
[im 7/31]
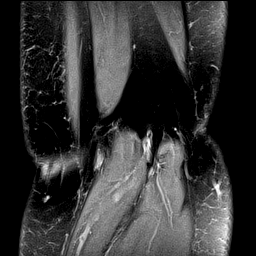
[im 13/31]
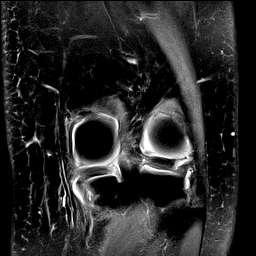
[im 19/31]
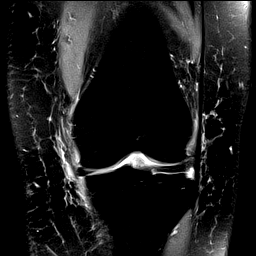
[im 25/31]
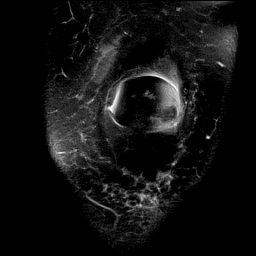
[im 31/31]
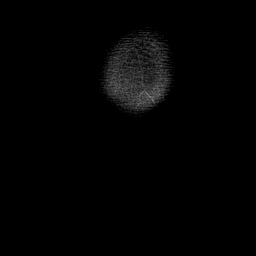

[Series 8: T2 fat-sat · sagittal · 3.0mm · 0.62mm/px · 7 of 37 slices shown (3 of 3)]
[im 1/37]
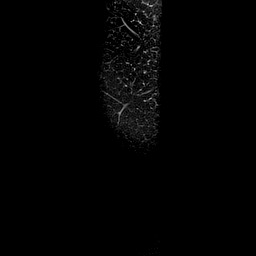
[im 7/37]
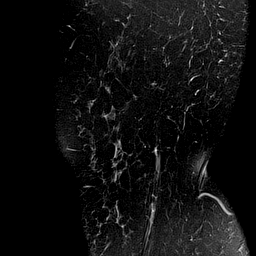
[im 13/37]
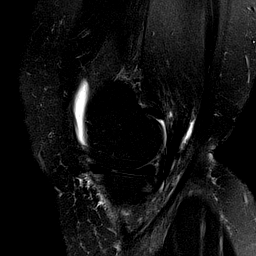
[im 19/37]
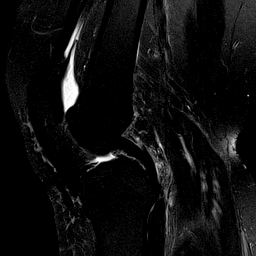
[im 25/37]
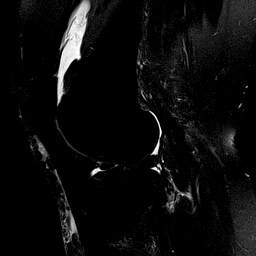
[im 31/37]
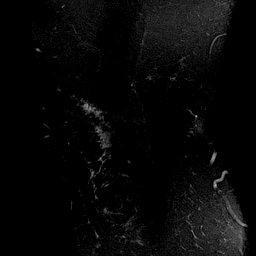
[im 37/37]
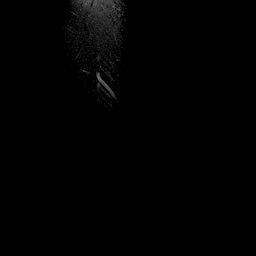

[Series 9: PD fat-sat · oblique · 2.0mm · 0.62mm/px · 3 of 19 slices shown (3 of 3)]
[im 1/19]
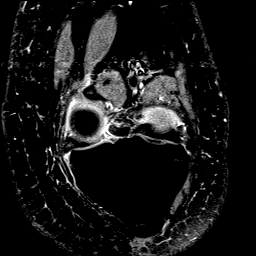
[im 10/19]
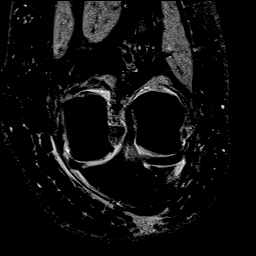
[im 19/19]
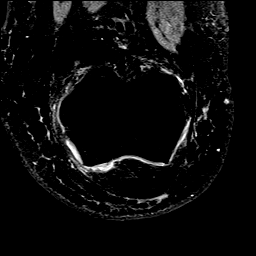

[40 of 40 positions shown; findings below may reference images not displayed]

FINDINGS: MENISCI

Medial meniscus: There is a complete radial tear through the root of
the posterior horn. Degenerative signal is seen within the remainder
of the posterior horn.

Lateral meniscus:  Intact.

LIGAMENTS

Cruciates:  Intact.

Collaterals:  Intact.

CARTILAGE

Patellofemoral: Cartilage thinning is worst at the patellar apex in
the superior pole.

Medial:  Mildly degenerated.

Lateral:  Mildly degenerated.

Joint:  Small effusion.

Popliteal Fossa:  Small Baker's cyst.

Extensor Mechanism:  Intact.

Bones: No fracture or worrisome lesion. Small osteophytes are seen
about the knee. Small foci of subchondral edema are seen at the apex
of the patella in the superior pole.

Other: None.
IMPRESSION: Complete radial tear root of the posterior horn of the medial
meniscus.

Mild osteoarthritis about the knee.
